# Patient Record
Sex: Female | Born: 1970 | Race: White | Hispanic: No | Marital: Married | Smoking: Never smoker
Health system: Southern US, Community
[De-identification: ages and names within clinical notes are randomized; demographics above are authoritative.]

## PROBLEM LIST (undated history)

## (undated) DIAGNOSIS — E7800 Pure hypercholesterolemia, unspecified: Secondary | ICD-10-CM

## (undated) DIAGNOSIS — Z91018 Allergy to other foods: Secondary | ICD-10-CM

## (undated) DIAGNOSIS — K900 Celiac disease: Secondary | ICD-10-CM

## (undated) DIAGNOSIS — E039 Hypothyroidism, unspecified: Secondary | ICD-10-CM

## (undated) HISTORY — DX: Allergy to other foods: Z91.018

## (undated) HISTORY — DX: Pure hypercholesterolemia, unspecified: E78.00

---

## 2019-04-23 DIAGNOSIS — Z01419 Encounter for gynecological examination (general) (routine) without abnormal findings: Secondary | ICD-10-CM | POA: Insufficient documentation

## 2019-12-18 DIAGNOSIS — E781 Pure hyperglyceridemia: Secondary | ICD-10-CM | POA: Diagnosis not present

## 2019-12-18 DIAGNOSIS — Z6841 Body Mass Index (BMI) 40.0 and over, adult: Secondary | ICD-10-CM | POA: Diagnosis not present

## 2019-12-18 DIAGNOSIS — Z79899 Other long term (current) drug therapy: Secondary | ICD-10-CM | POA: Diagnosis not present

## 2019-12-18 DIAGNOSIS — Z1331 Encounter for screening for depression: Secondary | ICD-10-CM | POA: Diagnosis not present

## 2019-12-18 DIAGNOSIS — Z1211 Encounter for screening for malignant neoplasm of colon: Secondary | ICD-10-CM | POA: Diagnosis not present

## 2019-12-18 DIAGNOSIS — E039 Hypothyroidism, unspecified: Secondary | ICD-10-CM | POA: Diagnosis not present

## 2019-12-18 DIAGNOSIS — G4700 Insomnia, unspecified: Secondary | ICD-10-CM | POA: Diagnosis not present

## 2019-12-20 DIAGNOSIS — E781 Pure hyperglyceridemia: Secondary | ICD-10-CM | POA: Diagnosis not present

## 2019-12-20 DIAGNOSIS — E039 Hypothyroidism, unspecified: Secondary | ICD-10-CM | POA: Diagnosis not present

## 2020-02-19 DIAGNOSIS — Z23 Encounter for immunization: Secondary | ICD-10-CM | POA: Diagnosis not present

## 2020-02-19 DIAGNOSIS — Z6841 Body Mass Index (BMI) 40.0 and over, adult: Secondary | ICD-10-CM | POA: Diagnosis not present

## 2020-02-19 DIAGNOSIS — H00014 Hordeolum externum left upper eyelid: Secondary | ICD-10-CM | POA: Diagnosis not present

## 2020-02-24 DIAGNOSIS — E039 Hypothyroidism, unspecified: Secondary | ICD-10-CM | POA: Diagnosis not present

## 2020-03-31 DIAGNOSIS — Z13220 Encounter for screening for lipoid disorders: Secondary | ICD-10-CM | POA: Diagnosis not present

## 2020-03-31 DIAGNOSIS — Z1231 Encounter for screening mammogram for malignant neoplasm of breast: Secondary | ICD-10-CM | POA: Diagnosis not present

## 2020-03-31 DIAGNOSIS — Z131 Encounter for screening for diabetes mellitus: Secondary | ICD-10-CM | POA: Diagnosis not present

## 2020-03-31 DIAGNOSIS — Z01419 Encounter for gynecological examination (general) (routine) without abnormal findings: Secondary | ICD-10-CM | POA: Diagnosis not present

## 2020-04-06 ENCOUNTER — Other Ambulatory Visit: Payer: Self-pay | Admitting: Certified Nurse Midwife

## 2020-04-06 DIAGNOSIS — Z1231 Encounter for screening mammogram for malignant neoplasm of breast: Secondary | ICD-10-CM

## 2020-04-16 DIAGNOSIS — R5382 Chronic fatigue, unspecified: Secondary | ICD-10-CM | POA: Diagnosis not present

## 2020-04-16 DIAGNOSIS — N951 Menopausal and female climacteric states: Secondary | ICD-10-CM | POA: Diagnosis not present

## 2020-04-16 DIAGNOSIS — R5383 Other fatigue: Secondary | ICD-10-CM | POA: Diagnosis not present

## 2020-05-14 DIAGNOSIS — R5382 Chronic fatigue, unspecified: Secondary | ICD-10-CM | POA: Diagnosis not present

## 2020-05-14 DIAGNOSIS — H5213 Myopia, bilateral: Secondary | ICD-10-CM | POA: Diagnosis not present

## 2020-05-14 DIAGNOSIS — K115 Sialolithiasis: Secondary | ICD-10-CM | POA: Diagnosis not present

## 2020-05-21 ENCOUNTER — Other Ambulatory Visit: Payer: Self-pay

## 2020-05-21 ENCOUNTER — Ambulatory Visit
Admission: RE | Admit: 2020-05-21 | Discharge: 2020-05-21 | Disposition: A | Discharge status: Home / Self Care | Payer: Medicaid Other | Source: Ambulatory Visit | Attending: Certified Nurse Midwife | Admitting: Certified Nurse Midwife

## 2020-05-21 DIAGNOSIS — Z1231 Encounter for screening mammogram for malignant neoplasm of breast: Secondary | ICD-10-CM | POA: Diagnosis not present

## 2020-06-11 DIAGNOSIS — R5382 Chronic fatigue, unspecified: Secondary | ICD-10-CM | POA: Diagnosis not present

## 2020-06-12 ENCOUNTER — Other Ambulatory Visit: Payer: Self-pay | Admitting: Certified Nurse Midwife

## 2020-06-12 DIAGNOSIS — R928 Other abnormal and inconclusive findings on diagnostic imaging of breast: Secondary | ICD-10-CM

## 2020-06-12 DIAGNOSIS — N6489 Other specified disorders of breast: Secondary | ICD-10-CM

## 2020-06-15 DIAGNOSIS — K116 Mucocele of salivary gland: Secondary | ICD-10-CM | POA: Diagnosis not present

## 2020-06-15 DIAGNOSIS — Z9889 Other specified postprocedural states: Secondary | ICD-10-CM | POA: Diagnosis not present

## 2020-06-15 DIAGNOSIS — J342 Deviated nasal septum: Secondary | ICD-10-CM | POA: Diagnosis not present

## 2020-06-22 ENCOUNTER — Ambulatory Visit
Admission: RE | Admit: 2020-06-22 | Discharge: 2020-06-22 | Disposition: A | Discharge status: Home / Self Care | Payer: Medicaid Other | Source: Ambulatory Visit | Attending: Certified Nurse Midwife | Admitting: Certified Nurse Midwife

## 2020-06-22 ENCOUNTER — Other Ambulatory Visit: Payer: Self-pay

## 2020-06-22 DIAGNOSIS — R928 Other abnormal and inconclusive findings on diagnostic imaging of breast: Secondary | ICD-10-CM | POA: Diagnosis present

## 2020-06-22 DIAGNOSIS — N6489 Other specified disorders of breast: Secondary | ICD-10-CM | POA: Diagnosis present

## 2020-06-22 DIAGNOSIS — R922 Inconclusive mammogram: Secondary | ICD-10-CM | POA: Diagnosis not present

## 2020-06-26 DIAGNOSIS — H524 Presbyopia: Secondary | ICD-10-CM | POA: Diagnosis not present

## 2020-07-09 DIAGNOSIS — R5382 Chronic fatigue, unspecified: Secondary | ICD-10-CM | POA: Diagnosis not present

## 2020-07-09 DIAGNOSIS — F411 Generalized anxiety disorder: Secondary | ICD-10-CM | POA: Diagnosis not present

## 2020-07-21 DIAGNOSIS — Z01818 Encounter for other preprocedural examination: Secondary | ICD-10-CM | POA: Diagnosis not present

## 2020-07-21 DIAGNOSIS — Z1211 Encounter for screening for malignant neoplasm of colon: Secondary | ICD-10-CM | POA: Diagnosis not present

## 2020-08-12 DIAGNOSIS — N951 Menopausal and female climacteric states: Secondary | ICD-10-CM | POA: Diagnosis not present

## 2020-08-12 DIAGNOSIS — E782 Mixed hyperlipidemia: Secondary | ICD-10-CM | POA: Diagnosis not present

## 2020-08-12 DIAGNOSIS — E559 Vitamin D deficiency, unspecified: Secondary | ICD-10-CM | POA: Diagnosis not present

## 2020-08-12 DIAGNOSIS — E038 Other specified hypothyroidism: Secondary | ICD-10-CM | POA: Diagnosis not present

## 2020-08-12 DIAGNOSIS — E119 Type 2 diabetes mellitus without complications: Secondary | ICD-10-CM | POA: Diagnosis not present

## 2020-08-12 DIAGNOSIS — R5382 Chronic fatigue, unspecified: Secondary | ICD-10-CM | POA: Diagnosis not present

## 2020-08-12 DIAGNOSIS — G4701 Insomnia due to medical condition: Secondary | ICD-10-CM | POA: Diagnosis not present

## 2020-08-12 DIAGNOSIS — I10 Essential (primary) hypertension: Secondary | ICD-10-CM | POA: Diagnosis not present

## 2020-08-12 DIAGNOSIS — D518 Other vitamin B12 deficiency anemias: Secondary | ICD-10-CM | POA: Diagnosis not present

## 2020-08-12 DIAGNOSIS — F411 Generalized anxiety disorder: Secondary | ICD-10-CM | POA: Diagnosis not present

## 2020-09-07 DIAGNOSIS — R944 Abnormal results of kidney function studies: Secondary | ICD-10-CM | POA: Diagnosis not present

## 2020-09-07 DIAGNOSIS — R5382 Chronic fatigue, unspecified: Secondary | ICD-10-CM | POA: Diagnosis not present

## 2020-09-07 DIAGNOSIS — G4701 Insomnia due to medical condition: Secondary | ICD-10-CM | POA: Diagnosis not present

## 2020-10-01 ENCOUNTER — Encounter: Payer: Self-pay | Admitting: *Deleted

## 2020-10-02 ENCOUNTER — Ambulatory Visit
Admission: RE | Admit: 2020-10-02 | Discharge: 2020-10-02 | Disposition: A | Discharge status: Home / Self Care | Payer: Medicaid Other | Attending: Gastroenterology | Admitting: Gastroenterology

## 2020-10-02 ENCOUNTER — Encounter
Admission: RE | Disposition: A | Discharge status: Home / Self Care | Payer: Self-pay | Source: Home / Self Care | Attending: Gastroenterology

## 2020-10-02 ENCOUNTER — Ambulatory Visit: Payer: Medicaid Other | Admitting: Anesthesiology

## 2020-10-02 DIAGNOSIS — E039 Hypothyroidism, unspecified: Secondary | ICD-10-CM | POA: Insufficient documentation

## 2020-10-02 DIAGNOSIS — D123 Benign neoplasm of transverse colon: Secondary | ICD-10-CM | POA: Diagnosis not present

## 2020-10-02 DIAGNOSIS — K640 First degree hemorrhoids: Secondary | ICD-10-CM | POA: Diagnosis not present

## 2020-10-02 DIAGNOSIS — Z1211 Encounter for screening for malignant neoplasm of colon: Secondary | ICD-10-CM | POA: Insufficient documentation

## 2020-10-02 DIAGNOSIS — Z79899 Other long term (current) drug therapy: Secondary | ICD-10-CM | POA: Insufficient documentation

## 2020-10-02 DIAGNOSIS — Z79890 Hormone replacement therapy (postmenopausal): Secondary | ICD-10-CM | POA: Insufficient documentation

## 2020-10-02 DIAGNOSIS — K635 Polyp of colon: Secondary | ICD-10-CM | POA: Diagnosis not present

## 2020-10-02 DIAGNOSIS — K648 Other hemorrhoids: Secondary | ICD-10-CM | POA: Diagnosis not present

## 2020-10-02 HISTORY — DX: Hypothyroidism, unspecified: E03.9

## 2020-10-02 HISTORY — DX: Celiac disease: K90.0

## 2020-10-02 HISTORY — PX: COLONOSCOPY: SHX5424

## 2020-10-02 SURGERY — COLONOSCOPY
Anesthesia: General

## 2020-10-02 MED ORDER — LIDOCAINE HCL (CARDIAC) PF 100 MG/5ML IV SOSY
PREFILLED_SYRINGE | INTRAVENOUS | Status: DC | PRN
  Administered 2020-10-02: 20 mg via INTRAVENOUS

## 2020-10-02 MED ORDER — PROPOFOL 10 MG/ML IV BOLUS
INTRAVENOUS | Status: AC
  Filled 2020-10-02: qty 20

## 2020-10-02 MED ORDER — PROPOFOL 10 MG/ML IV BOLUS
INTRAVENOUS | Status: DC | PRN
  Administered 2020-10-02: 100 mg via INTRAVENOUS

## 2020-10-02 MED ORDER — PROPOFOL 500 MG/50ML IV EMUL
INTRAVENOUS | Status: DC | PRN
  Administered 2020-10-02: 200 ug/kg/min via INTRAVENOUS

## 2020-10-02 MED ORDER — PROPOFOL 500 MG/50ML IV EMUL
INTRAVENOUS | Status: AC
  Filled 2020-10-02: qty 50

## 2020-10-02 MED ORDER — SODIUM CHLORIDE 0.9 % IV SOLN
INTRAVENOUS | Status: DC
  Administered 2020-10-02: 20 mL/h via INTRAVENOUS

## 2020-10-02 NOTE — Op Note (Signed)
[facility] Gastroenterology Patient Name: Ebony Ortiz Procedure Date: 10/02/2020 8:18 AM MRN: [id number] Account #: [id number] Date of Birth: 07-09-1970 Admit Type: Outpatient Age: 50 Room: [facility] ENDO ROOM 3 Gender: Female Note Status: Finalized Procedure:             Colonoscopy Indications:           Screening for colorectal malignant neoplasm Providers:             Andrey Farmer MD, MD Referring MD:          Orlando Penner. Iona Beard (Referring MD) Medicines:             Monitored Anesthesia Care Complications:         No immediate complications. Estimated blood loss:                         Minimal. Procedure:             Pre-Anesthesia Assessment:                        - Prior to the procedure, a History and Physical was                         performed, and patient medications and allergies were                         reviewed. The patient is competent. The risks and                         benefits of the procedure and the sedation options and                         risks were discussed with the patient. All questions                         were answered and informed consent was obtained.                         Patient identification and proposed procedure were                         verified by the physician, the nurse, the anesthetist                         and the technician in the endoscopy suite. Mental                         Status Examination: alert and oriented. Airway                         Examination: normal oropharyngeal airway and neck                         mobility. Respiratory Examination: clear to                         auscultation. CV Examination: normal. Prophylactic  Antibiotics: The patient does not require prophylactic                         antibiotics. Prior Anticoagulants: The patient has                         taken no previous anticoagulant or antiplatelet                         agents. ASA Grade  Assessment: II - A patient with mild                         systemic disease. After reviewing the risks and                         benefits, the patient was deemed in satisfactory                         condition to undergo the procedure. The anesthesia                         plan was to use monitored anesthesia care (MAC).                         Immediately prior to administration of medications,                         the patient was re-assessed for adequacy to receive                         sedatives. The heart rate, respiratory rate, oxygen                         saturations, blood pressure, adequacy of pulmonary                         ventilation, and response to care were monitored                         throughout the procedure. The physical status of the                         patient was re-assessed after the procedure.                        After obtaining informed consent, the colonoscope was                         passed under direct vision. Throughout the procedure,                         the patient's blood pressure, pulse, and oxygen                         saturations were monitored continuously. The                         Colonoscope was introduced through the anus and  advanced to the the cecum, identified by appendiceal                         orifice and ileocecal valve. The colonoscopy was                         performed without difficulty. The patient tolerated                         the procedure well. The quality of the bowel                         preparation was good. Findings:      The perianal and digital rectal examinations were normal.      A less than 1 mm polyp was found in the proximal transverse colon. The       polyp was sessile. The polyp was removed with a jumbo cold forceps.       Resection and retrieval were complete. Estimated blood loss was minimal.      Internal hemorrhoids were found during retroflexion.  The hemorrhoids       were Grade I (internal hemorrhoids that do not prolapse).      The exam was otherwise without abnormality on direct and retroflexion       views. Impression:            - One less than 1 mm polyp in the proximal transverse                         colon, removed with a jumbo cold forceps. Resected and                         retrieved.                        - Internal hemorrhoids.                        - The examination was otherwise normal on direct and                         retroflexion views. Recommendation:        - Discharge patient to home.                        - Resume previous diet.                        - Continue present medications.                        - Await pathology results.                        - Repeat colonoscopy for surveillance based on                         pathology results.                        - Return to referring physician as previously  scheduled. Procedure Code(s):     --- Professional ---                        [id number], Colonoscopy, flexible; with biopsy, single or                         multiple Diagnosis Code(s):     --- Professional ---                        Z12.11, Encounter for screening for malignant neoplasm                         of colon                        K63.5, Polyp of colon                        K64.0, First degree hemorrhoids CPT copyright 2019 [employer]. All rights reserved. The codes documented in this report are preliminary and upon coder review may  be revised to meet current compliance requirements. Andrey Farmer MD, MD 10/02/2020 8:52:25 AM Number of Addenda: 0 Note Initiated On: 10/02/2020 8:18 AM Scope Withdrawal Time: 0 hours 8 minutes 6 seconds  Total Procedure Duration: 0 hours 13 minutes 22 seconds  Estimated Blood Loss:  Estimated blood loss was minimal.      [facility]

## 2020-10-02 NOTE — H&P (Signed)
Outpatient short stay form Pre-procedure 10/02/2020 8:19 AM Raylene Miyamoto MD, MPH  Primary Physician: Dr. Iona Beard  Reason for visit:  Screening  History of present illness:   50 y/o lady with history of hypothyroidism here for screening colonoscopy. No abdominal surgeries. No blood thinners. No family history of GI malignancies.    Current Facility-Administered Medications:  .  0.9 %  sodium chloride infusion, , Intravenous, Continuous, Lear Carstens, Hilton Cork, MD, Last Rate: 20 mL/hr at 10/02/20 0741, 20 mL/hr at 10/02/20 0741  Medications Prior to Admission  Medication Sig Dispense Refill Last Dose  . ALPRAZolam (XANAX) 1 MG tablet Take 1 mg by mouth at bedtime as needed for anxiety.   Past Week at Unknown time  . APPLE CIDER VINEGAR PO Take by mouth daily.   Past Week at Unknown time  . Ascorbic Acid (VITAMIN C) 1000 MG tablet Take 1,000 mg by mouth daily.   Past Week at Unknown time  . Calcium Carbonate-Vitamin D (OS-CAL 500 + D PO) Take by mouth daily.   Past Week at Unknown time  . fenofibrate (TRICOR) 145 MG tablet Take 145 mg by mouth daily.   Past Week at Unknown time  . levothyroxine (SYNTHROID) 125 MCG tablet Take 125 mcg by mouth daily before breakfast.   10/02/2020 at 0600  . Magnesium 200 MG TABS Take by mouth daily.   Past Week at Unknown time  . Multiple Vitamin (MULTIVITAMIN) capsule Take 1 capsule by mouth daily.   Past Week at Unknown time  . phentermine (ADIPEX-P) 37.5 MG tablet Take 37.5 mg by mouth daily.   Past Week at Unknown time  . progesterone (PROMETRIUM) 200 MG capsule Take 200 mg by mouth daily.   Past Week at Unknown time  . traZODone (DESYREL) 100 MG tablet Take 200 mg by mouth at bedtime.   Past Week at Unknown time  . VITAMIN B COMPLEX-C PO Take by mouth.   Past Week at Unknown time     No Known Allergies   Past Medical History:  Diagnosis Date  . Celiac disease   . Hypothyroidism     Review of systems:  Otherwise negative.    Physical  Exam  Gen: Alert, oriented. Appears stated age.  HEENT: PERRLA. Lungs: No respiratory distress CV: RRR Abd: soft, benign, no masses Ext: No edema    Planned procedures: Proceed with colonoscopy. The patient understands the nature of the planned procedure, indications, risks, alternatives and potential complications including but not limited to bleeding, infection, perforation, damage to internal organs and possible oversedation/side effects from anesthesia. The patient agrees and gives consent to proceed.  Please refer to procedure notes for findings, recommendations and patient disposition/instructions.     Raylene Miyamoto MD, MPH Gastroenterology 10/02/2020  8:19 AM

## 2020-10-02 NOTE — Transfer of Care (Signed)
Immediate Anesthesia Transfer of Care Note  Patient: Ebony Ortiz  Procedure(s) Performed: COLONOSCOPY (N/A )  Patient Location: PACU  Anesthesia Type:MAC  Level of Consciousness: awake, alert  and oriented  Airway & Oxygen Therapy: Patient Spontanous Breathing and Patient connected to nasal cannula oxygen  Post-op Assessment: Report given to RN and Post -op Vital signs reviewed and stable  Post vital signs: Reviewed and stable  Last Vitals:  Vitals Value Taken Time  BP 133/66 10/02/20 0855  Temp    Pulse 80 10/02/20 0901  Resp 21 10/02/20 0901  SpO2 95 % 10/02/20 0901  Vitals shown include unvalidated device data.  Last Pain:  Vitals:   10/02/20 0850  TempSrc: Temporal  PainSc:          Complications: No complications documented.

## 2020-10-02 NOTE — Anesthesia Preprocedure Evaluation (Signed)
Anesthesia Evaluation  Patient identified by MRN, date of birth, ID band Patient awake    Reviewed: Allergy & Precautions, H&P , NPO status , Patient's Chart, lab work & pertinent test results, reviewed documented beta blocker date and time   Airway Mallampati: II   Neck ROM: full    Dental  (+) Poor Dentition   Pulmonary neg pulmonary ROS,    Pulmonary exam normal        Cardiovascular Exercise Tolerance: Good negative cardio ROS Normal cardiovascular exam Rhythm:regular Rate:Normal     Neuro/Psych negative neurological ROS  negative psych ROS   GI/Hepatic negative GI ROS, Neg liver ROS,   Endo/Other  Hypothyroidism   Renal/GU negative Renal ROS  negative genitourinary   Musculoskeletal   Abdominal   Peds  Hematology negative hematology ROS (+)   Anesthesia Other Findings Past Medical History: No date: Celiac disease No date: Hypothyroidism No past surgical history on file. BMI    Body Mass Index: 39.86 kg/m     Reproductive/Obstetrics negative OB ROS                             Anesthesia Physical Anesthesia Plan  ASA: II  Anesthesia Plan: General   Post-op Pain Management:    Induction:   PONV Risk Score and Plan:   Airway Management Planned:   Additional Equipment:   Intra-op Plan:   Post-operative Plan:   Informed Consent: I have reviewed the patients History and Physical, chart, labs and discussed the procedure including the risks, benefits and alternatives for the proposed anesthesia with the patient or authorized representative who has indicated his/her understanding and acceptance.     Dental Advisory Given  Plan Discussed with: CRNA  Anesthesia Plan Comments:         Anesthesia Quick Evaluation

## 2020-10-02 NOTE — Interval H&P Note (Signed)
History and Physical Interval Note:  10/02/2020 8:21 AM  Ebony Ortiz  has presented today for surgery, with the diagnosis of COLON CANCER SCREENING.  The various methods of treatment have been discussed with the patient and family. After consideration of risks, benefits and other options for treatment, the patient has consented to  Procedure(s): COLONOSCOPY (N/A) as a surgical intervention.  The patient's history has been reviewed, patient examined, no change in status, stable for surgery.  I have reviewed the patient's chart and labs.  Questions were answered to the patient's satisfaction.     Lesly Rubenstein  Ok to proceed with colonoscopy

## 2020-10-05 ENCOUNTER — Encounter: Payer: Self-pay | Admitting: Gastroenterology

## 2020-10-05 DIAGNOSIS — R5382 Chronic fatigue, unspecified: Secondary | ICD-10-CM | POA: Diagnosis not present

## 2020-10-05 LAB — SURGICAL PATHOLOGY

## 2020-10-08 NOTE — Anesthesia Postprocedure Evaluation (Signed)
Anesthesia Post Note  Patient: Ebony Ortiz  Procedure(s) Performed: COLONOSCOPY  Anesthesia Type: General Anesthetic complications: no   No notable events documented.   Last Vitals:  Vitals:   10/02/20 0910 10/02/20 0920  BP: 123/72 123/77  Pulse: 72 71  Resp: (!) 36 14  Temp:    SpO2: 100% 97%    Last Pain:  Vitals:   10/04/20 1511  TempSrc:   PainSc: 0-No pain                 Ebony Ortiz

## 2020-11-03 DIAGNOSIS — R5382 Chronic fatigue, unspecified: Secondary | ICD-10-CM | POA: Diagnosis not present

## 2020-12-01 DIAGNOSIS — R5382 Chronic fatigue, unspecified: Secondary | ICD-10-CM | POA: Diagnosis not present

## 2020-12-31 DIAGNOSIS — R5382 Chronic fatigue, unspecified: Secondary | ICD-10-CM | POA: Diagnosis not present

## 2021-02-01 DIAGNOSIS — R5382 Chronic fatigue, unspecified: Secondary | ICD-10-CM | POA: Diagnosis not present

## 2021-03-01 DIAGNOSIS — F411 Generalized anxiety disorder: Secondary | ICD-10-CM | POA: Diagnosis not present

## 2021-03-01 DIAGNOSIS — R5382 Chronic fatigue, unspecified: Secondary | ICD-10-CM | POA: Diagnosis not present

## 2021-03-02 DIAGNOSIS — Z419 Encounter for procedure for purposes other than remedying health state, unspecified: Secondary | ICD-10-CM | POA: Diagnosis not present

## 2021-03-22 DIAGNOSIS — E559 Vitamin D deficiency, unspecified: Secondary | ICD-10-CM | POA: Diagnosis not present

## 2021-03-22 DIAGNOSIS — R5382 Chronic fatigue, unspecified: Secondary | ICD-10-CM | POA: Diagnosis not present

## 2021-03-22 DIAGNOSIS — E038 Other specified hypothyroidism: Secondary | ICD-10-CM | POA: Diagnosis not present

## 2021-03-22 DIAGNOSIS — E782 Mixed hyperlipidemia: Secondary | ICD-10-CM | POA: Diagnosis not present

## 2021-03-22 DIAGNOSIS — E119 Type 2 diabetes mellitus without complications: Secondary | ICD-10-CM | POA: Diagnosis not present

## 2021-03-22 DIAGNOSIS — F411 Generalized anxiety disorder: Secondary | ICD-10-CM | POA: Diagnosis not present

## 2021-03-22 DIAGNOSIS — I10 Essential (primary) hypertension: Secondary | ICD-10-CM | POA: Diagnosis not present

## 2021-03-22 DIAGNOSIS — D518 Other vitamin B12 deficiency anemias: Secondary | ICD-10-CM | POA: Diagnosis not present

## 2021-04-01 DIAGNOSIS — Z01419 Encounter for gynecological examination (general) (routine) without abnormal findings: Secondary | ICD-10-CM | POA: Diagnosis not present

## 2021-04-01 DIAGNOSIS — Z1231 Encounter for screening mammogram for malignant neoplasm of breast: Secondary | ICD-10-CM | POA: Diagnosis not present

## 2021-04-01 DIAGNOSIS — Z419 Encounter for procedure for purposes other than remedying health state, unspecified: Secondary | ICD-10-CM | POA: Diagnosis not present

## 2021-04-21 DIAGNOSIS — E782 Mixed hyperlipidemia: Secondary | ICD-10-CM | POA: Diagnosis not present

## 2021-04-21 DIAGNOSIS — R5382 Chronic fatigue, unspecified: Secondary | ICD-10-CM | POA: Diagnosis not present

## 2021-04-21 DIAGNOSIS — F411 Generalized anxiety disorder: Secondary | ICD-10-CM | POA: Diagnosis not present

## 2021-05-02 DIAGNOSIS — Z419 Encounter for procedure for purposes other than remedying health state, unspecified: Secondary | ICD-10-CM | POA: Diagnosis not present

## 2021-05-20 DIAGNOSIS — F411 Generalized anxiety disorder: Secondary | ICD-10-CM | POA: Diagnosis not present

## 2021-05-20 DIAGNOSIS — R5382 Chronic fatigue, unspecified: Secondary | ICD-10-CM | POA: Diagnosis not present

## 2021-06-02 DIAGNOSIS — Z419 Encounter for procedure for purposes other than remedying health state, unspecified: Secondary | ICD-10-CM | POA: Diagnosis not present

## 2021-06-30 DIAGNOSIS — Z419 Encounter for procedure for purposes other than remedying health state, unspecified: Secondary | ICD-10-CM | POA: Diagnosis not present

## 2021-07-05 ENCOUNTER — Other Ambulatory Visit: Payer: Self-pay | Admitting: Certified Nurse Midwife

## 2021-07-05 DIAGNOSIS — Z1231 Encounter for screening mammogram for malignant neoplasm of breast: Secondary | ICD-10-CM

## 2021-07-28 ENCOUNTER — Ambulatory Visit: Payer: Medicaid Other

## 2021-07-28 DIAGNOSIS — J019 Acute sinusitis, unspecified: Secondary | ICD-10-CM | POA: Diagnosis not present

## 2021-07-31 DIAGNOSIS — Z419 Encounter for procedure for purposes other than remedying health state, unspecified: Secondary | ICD-10-CM | POA: Diagnosis not present

## 2021-08-11 ENCOUNTER — Ambulatory Visit
Admission: RE | Admit: 2021-08-11 | Discharge: 2021-08-11 | Disposition: A | Discharge status: Home / Self Care | Payer: Medicaid Other | Source: Ambulatory Visit | Attending: Certified Nurse Midwife | Admitting: Certified Nurse Midwife

## 2021-08-11 DIAGNOSIS — Z1231 Encounter for screening mammogram for malignant neoplasm of breast: Secondary | ICD-10-CM | POA: Insufficient documentation

## 2021-08-18 DIAGNOSIS — E119 Type 2 diabetes mellitus without complications: Secondary | ICD-10-CM | POA: Diagnosis not present

## 2021-08-18 DIAGNOSIS — I10 Essential (primary) hypertension: Secondary | ICD-10-CM | POA: Diagnosis not present

## 2021-08-18 DIAGNOSIS — E559 Vitamin D deficiency, unspecified: Secondary | ICD-10-CM | POA: Diagnosis not present

## 2021-08-18 DIAGNOSIS — N951 Menopausal and female climacteric states: Secondary | ICD-10-CM | POA: Diagnosis not present

## 2021-08-18 DIAGNOSIS — D518 Other vitamin B12 deficiency anemias: Secondary | ICD-10-CM | POA: Diagnosis not present

## 2021-08-18 DIAGNOSIS — R5382 Chronic fatigue, unspecified: Secondary | ICD-10-CM | POA: Diagnosis not present

## 2021-08-18 DIAGNOSIS — J019 Acute sinusitis, unspecified: Secondary | ICD-10-CM | POA: Diagnosis not present

## 2021-08-18 DIAGNOSIS — E7849 Other hyperlipidemia: Secondary | ICD-10-CM | POA: Diagnosis not present

## 2021-08-18 DIAGNOSIS — Z79899 Other long term (current) drug therapy: Secondary | ICD-10-CM | POA: Diagnosis not present

## 2021-08-18 DIAGNOSIS — E038 Other specified hypothyroidism: Secondary | ICD-10-CM | POA: Diagnosis not present

## 2021-08-30 DIAGNOSIS — Z419 Encounter for procedure for purposes other than remedying health state, unspecified: Secondary | ICD-10-CM | POA: Diagnosis not present

## 2021-09-30 DIAGNOSIS — Z419 Encounter for procedure for purposes other than remedying health state, unspecified: Secondary | ICD-10-CM | POA: Diagnosis not present

## 2021-12-07 DIAGNOSIS — Z1331 Encounter for screening for depression: Secondary | ICD-10-CM | POA: Diagnosis not present

## 2021-12-07 DIAGNOSIS — Z79899 Other long term (current) drug therapy: Secondary | ICD-10-CM | POA: Diagnosis not present

## 2021-12-07 DIAGNOSIS — E039 Hypothyroidism, unspecified: Secondary | ICD-10-CM | POA: Diagnosis not present

## 2021-12-07 DIAGNOSIS — G4700 Insomnia, unspecified: Secondary | ICD-10-CM | POA: Diagnosis not present

## 2021-12-07 DIAGNOSIS — E781 Pure hyperglyceridemia: Secondary | ICD-10-CM | POA: Diagnosis not present

## 2022-04-01 DIAGNOSIS — Z419 Encounter for procedure for purposes other than remedying health state, unspecified: Secondary | ICD-10-CM | POA: Diagnosis not present

## 2022-04-05 DIAGNOSIS — Z13220 Encounter for screening for lipoid disorders: Secondary | ICD-10-CM | POA: Diagnosis not present

## 2022-04-05 DIAGNOSIS — Z131 Encounter for screening for diabetes mellitus: Secondary | ICD-10-CM | POA: Diagnosis not present

## 2022-04-05 DIAGNOSIS — Z130 Encounter for screening for diseases of the blood and blood-forming organs and certain disorders involving the immune mechanism: Secondary | ICD-10-CM | POA: Diagnosis not present

## 2022-04-05 DIAGNOSIS — Z01419 Encounter for gynecological examination (general) (routine) without abnormal findings: Secondary | ICD-10-CM | POA: Diagnosis not present

## 2022-05-02 DIAGNOSIS — Z419 Encounter for procedure for purposes other than remedying health state, unspecified: Secondary | ICD-10-CM | POA: Diagnosis not present

## 2022-05-09 DIAGNOSIS — H5213 Myopia, bilateral: Secondary | ICD-10-CM | POA: Diagnosis not present

## 2022-05-18 DIAGNOSIS — Z20828 Contact with and (suspected) exposure to other viral communicable diseases: Secondary | ICD-10-CM | POA: Diagnosis not present

## 2022-05-18 DIAGNOSIS — U071 COVID-19: Secondary | ICD-10-CM | POA: Diagnosis not present

## 2022-05-18 DIAGNOSIS — J069 Acute upper respiratory infection, unspecified: Secondary | ICD-10-CM | POA: Diagnosis not present

## 2022-06-02 DIAGNOSIS — Z419 Encounter for procedure for purposes other than remedying health state, unspecified: Secondary | ICD-10-CM | POA: Diagnosis not present

## 2022-06-29 DIAGNOSIS — J302 Other seasonal allergic rhinitis: Secondary | ICD-10-CM | POA: Diagnosis not present

## 2022-06-29 DIAGNOSIS — Z79899 Other long term (current) drug therapy: Secondary | ICD-10-CM | POA: Diagnosis not present

## 2022-06-29 DIAGNOSIS — E1165 Type 2 diabetes mellitus with hyperglycemia: Secondary | ICD-10-CM | POA: Diagnosis not present

## 2022-06-29 DIAGNOSIS — E7849 Other hyperlipidemia: Secondary | ICD-10-CM | POA: Diagnosis not present

## 2022-06-29 DIAGNOSIS — M25561 Pain in right knee: Secondary | ICD-10-CM | POA: Diagnosis not present

## 2022-06-29 DIAGNOSIS — D518 Other vitamin B12 deficiency anemias: Secondary | ICD-10-CM | POA: Diagnosis not present

## 2022-06-29 DIAGNOSIS — E038 Other specified hypothyroidism: Secondary | ICD-10-CM | POA: Diagnosis not present

## 2022-06-29 DIAGNOSIS — E559 Vitamin D deficiency, unspecified: Secondary | ICD-10-CM | POA: Diagnosis not present

## 2022-06-29 DIAGNOSIS — F411 Generalized anxiety disorder: Secondary | ICD-10-CM | POA: Diagnosis not present

## 2022-06-29 DIAGNOSIS — M79642 Pain in left hand: Secondary | ICD-10-CM | POA: Diagnosis not present

## 2022-06-29 DIAGNOSIS — I10 Essential (primary) hypertension: Secondary | ICD-10-CM | POA: Diagnosis not present

## 2022-06-29 DIAGNOSIS — E782 Mixed hyperlipidemia: Secondary | ICD-10-CM | POA: Diagnosis not present

## 2022-07-01 DIAGNOSIS — Z419 Encounter for procedure for purposes other than remedying health state, unspecified: Secondary | ICD-10-CM | POA: Diagnosis not present

## 2022-07-07 ENCOUNTER — Other Ambulatory Visit: Payer: Self-pay | Admitting: Family

## 2022-07-07 DIAGNOSIS — Z1231 Encounter for screening mammogram for malignant neoplasm of breast: Secondary | ICD-10-CM

## 2022-07-07 DIAGNOSIS — M67442 Ganglion, left hand: Secondary | ICD-10-CM | POA: Diagnosis not present

## 2022-07-26 ENCOUNTER — Ambulatory Visit (INDEPENDENT_AMBULATORY_CARE_PROVIDER_SITE_OTHER): Payer: Medicaid Other | Admitting: Bariatrics

## 2022-07-26 ENCOUNTER — Encounter: Payer: Self-pay | Admitting: Bariatrics

## 2022-07-26 VITALS — BP 125/79 | HR 84 | Temp 97.8°F | Ht 63.0 in | Wt 266.0 lb

## 2022-07-26 DIAGNOSIS — Z6841 Body Mass Index (BMI) 40.0 and over, adult: Secondary | ICD-10-CM

## 2022-07-26 DIAGNOSIS — Z0289 Encounter for other administrative examinations: Secondary | ICD-10-CM

## 2022-07-26 DIAGNOSIS — K900 Celiac disease: Secondary | ICD-10-CM | POA: Diagnosis not present

## 2022-07-26 DIAGNOSIS — E038 Other specified hypothyroidism: Secondary | ICD-10-CM

## 2022-07-26 NOTE — Progress Notes (Signed)
Office: [phone]  /  Fax: [phone]   Initial Visit  Ebony Ortiz was seen in clinic today to evaluate for obesity. She is interested in losing weight to improve overall health and reduce the risk of weight related complications. She presents today to review program treatment options, initial physical assessment, and evaluation.     She was referred by: PCP  When asked what else they would like to accomplish? She states: Improve energy levels and physical activity and Improve quality of life  When asked how has your weight affected you? She states: Having fatigue  Some associated conditions: Other: hypothyroidism, Celiac disease,   Contributing factors: Family history  Weight promoting medications identified: None  Current nutrition plan: None, Low-carb, High-protein, and Other: Intermittent fasting  Current level of physical activity: None and Walking  Current or previous pharmacotherapy: Phentermine  Response to medication: Ineffective so it was discontinued   Past medical history includes:   Past Medical History:  Diagnosis Date   Celiac disease    Hypothyroidism      Objective:   BP 125/79   Pulse 84   Temp 97.8 F (36.6 C)   Ht 5\' 3"  (1.6 m)   Wt 266 lb (120.7 kg)   SpO2 97%   BMI 47.12 kg/m  She was weighed on the bioimpedance scale: Body mass index is 47.12 kg/m.  Peak Weight:266 , Body Fat%:51.1%, Visceral Fat Rating:18, Weight trend over the last 12 months: Increasing  General:  Alert, oriented and cooperative. Patient is in no acute distress.  Respiratory: Normal respiratory effort, no problems with respiration noted  Extremities: Normal range of motion.    Mental Status: Normal mood and affect. Normal behavior. Normal judgment and thought content.   DIAGNOSTIC DATA REVIEWED:  BMET No results found for: "NA", "K", "CL", "CO2", "GLUCOSE", "BUN", "CREATININE", "CALCIUM", "GFRNONAA", "GFRAA" No results found for: "HGBA1C" No results found for:  "INSULIN" CBC No results found for: "WBC", "RBC", "HGB", "HCT", "PLT", "MCV", "MCH", "MCHC", "RDW" Iron/TIBC/Ferritin/ %Sat No results found for: "IRON", "TIBC", "FERRITIN", "IRONPCTSAT" Lipid Panel  No results found for: "CHOL", "TRIG", "HDL", "CHOLHDL", "VLDL", "LDLCALC", "LDLDIRECT" Hepatic Function Panel  No results found for: "PROT", "ALBUMIN", "AST", "ALT", "ALKPHOS", "BILITOT", "BILIDIR", "IBILI" No results found for: "TSH"   Assessment and Plan:    1. Celiac Disease:  Plan: will adjust the food plan accordingly.   2. Hypothyroidism  Does report symptoms associated with uncontrolled hypothyroidism. She states that her PCP is referring her to an endocrinologist.  Medication(s): Levothyroxine 150 mcg daily per patient.   Plan: Follow-up with PCP and endocrinologist.   Plan: Continue levothyroxine at current dose and adjust per endocrinologist.  Counseling: The correct way to take levothyroxine is fasting, with water, separated by at least 30 minutes from breakfast, and separated by more than 4 hours from calcium, iron, multivitamins, acid reflux medications (PPIs).    3.Morbid obesity:   4. BMI = 47      Obesity Treatment / Action Plan:  Patient will work on garnering support from family and friends to begin weight loss journey. Will work on eliminating or reducing the presence of highly palatable, calorie dense foods in the home. Will complete provided nutritional and psychosocial assessment questionnaire before the next appointment. Will be scheduled for indirect calorimetry to determine resting energy expenditure in a fasting state.  This will allow [place] to create a reduced calorie, high-protein meal plan to promote loss of fat mass while preserving muscle mass.  Obesity Education Performed  Today:  She was weighed on the bioimpedance scale and results were discussed and documented in the synopsis.  We discussed obesity as a disease and the importance of a more  detailed evaluation of all the factors contributing to the disease.  We discussed the importance of long term lifestyle changes which include nutrition, exercise and behavioral modifications as well as the importance of customizing this to her specific health and social needs.  We discussed the benefits of reaching a healthier weight to alleviate the symptoms of existing conditions and reduce the risks of the biomechanical, metabolic and psychological effects of obesity.  Ebony Ortiz appears to be in the action stage of change and states they are ready to start intensive lifestyle modifications and behavioral modifications.  30 minutes was spent today on this visit including the above counseling, pre-visit chart review, and post-visit documentation.  Reviewed by clinician on day of visit: allergies, medications, problem list, medical history, surgical history, family history, social history, and previous encounter notes.    Ebony Ortiz A. Ruben ImO.

## 2022-08-01 DIAGNOSIS — Z419 Encounter for procedure for purposes other than remedying health state, unspecified: Secondary | ICD-10-CM | POA: Diagnosis not present

## 2022-08-10 DIAGNOSIS — L814 Other melanin hyperpigmentation: Secondary | ICD-10-CM | POA: Diagnosis not present

## 2022-08-10 DIAGNOSIS — D225 Melanocytic nevi of trunk: Secondary | ICD-10-CM | POA: Diagnosis not present

## 2022-08-10 DIAGNOSIS — L578 Other skin changes due to chronic exposure to nonionizing radiation: Secondary | ICD-10-CM | POA: Diagnosis not present

## 2022-08-10 DIAGNOSIS — D2239 Melanocytic nevi of other parts of face: Secondary | ICD-10-CM | POA: Diagnosis not present

## 2022-08-11 ENCOUNTER — Encounter: Payer: Self-pay | Admitting: Bariatrics

## 2022-08-11 ENCOUNTER — Ambulatory Visit (INDEPENDENT_AMBULATORY_CARE_PROVIDER_SITE_OTHER): Payer: Medicaid Other | Admitting: Bariatrics

## 2022-08-11 VITALS — BP 136/84 | HR 79 | Temp 98.2°F | Ht 63.0 in | Wt 270.0 lb

## 2022-08-11 DIAGNOSIS — E038 Other specified hypothyroidism: Secondary | ICD-10-CM

## 2022-08-11 DIAGNOSIS — K900 Celiac disease: Secondary | ICD-10-CM | POA: Diagnosis not present

## 2022-08-11 DIAGNOSIS — R0602 Shortness of breath: Secondary | ICD-10-CM | POA: Insufficient documentation

## 2022-08-11 DIAGNOSIS — R7309 Other abnormal glucose: Secondary | ICD-10-CM

## 2022-08-11 DIAGNOSIS — Z6841 Body Mass Index (BMI) 40.0 and over, adult: Secondary | ICD-10-CM | POA: Diagnosis not present

## 2022-08-11 DIAGNOSIS — Z1331 Encounter for screening for depression: Secondary | ICD-10-CM

## 2022-08-11 DIAGNOSIS — R5383 Other fatigue: Secondary | ICD-10-CM | POA: Diagnosis not present

## 2022-08-11 DIAGNOSIS — E7800 Pure hypercholesterolemia, unspecified: Secondary | ICD-10-CM

## 2022-08-11 DIAGNOSIS — Z0000 Encounter for general adult medical examination without abnormal findings: Secondary | ICD-10-CM | POA: Insufficient documentation

## 2022-08-11 DIAGNOSIS — E559 Vitamin D deficiency, unspecified: Secondary | ICD-10-CM

## 2022-08-12 LAB — CBC WITH DIFFERENTIAL/PLATELET
Basophils Absolute: 0 10*3/uL (ref 0.0–0.2)
Basos: 1 %
EOS (ABSOLUTE): 0.2 10*3/uL (ref 0.0–0.4)
Eos: 4 %
Hematocrit: 34.2 % (ref 34.0–46.6)
Hemoglobin: 11.9 g/dL (ref 11.1–15.9)
Immature Grans (Abs): 0.1 10*3/uL (ref 0.0–0.1)
Immature Granulocytes: 1 %
Lymphocytes Absolute: 1.1 10*3/uL (ref 0.7–3.1)
Lymphs: 24 %
MCH: 31.4 pg (ref 26.6–33.0)
MCHC: 34.8 g/dL (ref 31.5–35.7)
MCV: 90 fL (ref 79–97)
Monocytes Absolute: 0.4 10*3/uL (ref 0.1–0.9)
Monocytes: 8 %
Neutrophils Absolute: 2.9 10*3/uL (ref 1.4–7.0)
Neutrophils: 62 %
Platelets: 199 10*3/uL (ref 150–450)
RBC: 3.79 x10E6/uL (ref 3.77–5.28)
RDW: 12.4 % (ref 11.7–15.4)
WBC: 4.7 10*3/uL (ref 3.4–10.8)

## 2022-08-12 LAB — INSULIN, RANDOM: INSULIN: 27.7 u[IU]/mL — ABNORMAL HIGH (ref 2.6–24.9)

## 2022-08-12 LAB — COMPREHENSIVE METABOLIC PANEL
ALT: 38 IU/L — ABNORMAL HIGH (ref 0–32)
AST: 60 IU/L — ABNORMAL HIGH (ref 0–40)
Albumin/Globulin Ratio: 1.3 (ref 1.2–2.2)
Albumin: 4.5 g/dL (ref 3.8–4.9)
Alkaline Phosphatase: 49 IU/L (ref 44–121)
BUN/Creatinine Ratio: 20 (ref 9–23)
BUN: 22 mg/dL (ref 6–24)
Bilirubin Total: 0.4 mg/dL (ref 0.0–1.2)
CO2: 23 mmol/L (ref 20–29)
Calcium: 9.6 mg/dL (ref 8.7–10.2)
Chloride: 97 mmol/L (ref 96–106)
Creatinine, Ser: 1.12 mg/dL — ABNORMAL HIGH (ref 0.57–1.00)
Globulin, Total: 3.5 g/dL (ref 1.5–4.5)
Glucose: 91 mg/dL (ref 70–99)
Potassium: 4.4 mmol/L (ref 3.5–5.2)
Sodium: 134 mmol/L (ref 134–144)
Total Protein: 8 g/dL (ref 6.0–8.5)
eGFR: 59 mL/min/{1.73_m2} — ABNORMAL LOW (ref >59)

## 2022-08-12 LAB — LIPID PANEL WITH LDL/HDL RATIO
Cholesterol, Total: 148 mg/dL (ref 100–199)
HDL: 26 mg/dL — ABNORMAL LOW (ref >39)
LDL Chol Calc (NIH): 90 mg/dL (ref 0–99)
LDL/HDL Ratio: 3.5 ratio — ABNORMAL HIGH (ref 0.0–3.2)
Triglycerides: 185 mg/dL — ABNORMAL HIGH (ref 0–149)
VLDL Cholesterol Cal: 32 mg/dL (ref 5–40)

## 2022-08-12 LAB — TSH: TSH: 24 u[IU]/mL — ABNORMAL HIGH (ref 0.450–4.500)

## 2022-08-12 LAB — HEMOGLOBIN A1C
Est. average glucose Bld gHb Est-mCnc: 114 mg/dL
Hgb A1c MFr Bld: 5.6 % (ref 4.8–5.6)

## 2022-08-12 LAB — VITAMIN D 25 HYDROXY (VIT D DEFICIENCY, FRACTURES): Vit D, 25-Hydroxy: 29.7 ng/mL — ABNORMAL LOW (ref 30.0–100.0)

## 2022-08-12 LAB — VITAMIN B12: Vitamin B-12: 507 pg/mL (ref 232–1245)

## 2022-08-15 ENCOUNTER — Encounter: Payer: Self-pay | Admitting: Bariatrics

## 2022-08-15 ENCOUNTER — Other Ambulatory Visit (INDEPENDENT_AMBULATORY_CARE_PROVIDER_SITE_OTHER): Payer: Self-pay | Admitting: Bariatrics

## 2022-08-15 MED ORDER — LEVOTHYROXINE SODIUM 150 MCG PO TABS: 150.0000 ug | ORAL_TABLET | Freq: Every day | ORAL | 1 refills | Status: AC

## 2022-08-15 NOTE — Progress Notes (Signed)
Notified patient of Dr. Theora Gianotti recommendations. Confirmed patients pharmacy.

## 2022-08-16 ENCOUNTER — Encounter: Payer: Self-pay | Admitting: Bariatrics

## 2022-08-16 NOTE — Progress Notes (Signed)
Chief Complaint:   OBESITY Ebony Ortiz (MR# [id number]) is a 52 y.o. female who presents for evaluation and treatment of obesity and related comorbidities. Current BMI is Body mass index is 47.83 kg/m. Ebony Ortiz has been struggling with her weight for many years and has been unsuccessful in either losing weight, maintaining weight loss, or reaching her healthy weight goal.  Ebony Ortiz is here for an initial visit. She was see by me for hr information session visit.   Ebony Ortiz is currently in the action stage of change and ready to dedicate time achieving and maintaining a healthier weight. Ebony Ortiz is interested in becoming our patient and working on intensive lifestyle modifications including (but not limited to) diet and exercise for weight loss.  Ebony Ortiz's habits were reviewed today and are as follows: Her family eats meals together, she thinks her family will eat healthier with her, her desired weight loss is 105 lbs, she started gaining weight last year, her heaviest weight ever was 270 pounds, she has significant food cravings issues, she skips meals frequently, she is frequently drinking liquids with calories, and she struggles with emotional eating.  Depression Screen Ebony Ortiz's Food and Mood (modified PHQ-9) score was 12.  Subjective:   1. Other fatigue Ebony Ortiz admits to daytime somnolence and admits to waking up still tired. Patient has a history of symptoms of daytime fatigue and morning fatigue. Ebony Ortiz generally gets 3 or 4 hours of sleep per night, and states that she has nightime awakenings. Snoring is present. Apneic episodes are not present. Epworth Sleepiness Score is 3.   2. SOB (shortness of breath) on exertion Ebony Ortiz notes increasing shortness of breath with exercising and seems to be worsening over time with weight gain. She notes getting out of breath sooner with activity than she used to. This has not gotten worse recently. Ebony Ortiz denies shortness of breath at rest or orthopnea.  3. Other  specified hypothyroidism Ebony Ortiz is taking levothyroxine, unstable.   4. Celiac disease Ebony Ortiz has been avoiding all gluten.   5. Health care maintenance Given obesity.   6. Elevated cholesterol Ebony Ortiz is taking fenofibrate.   7. Vitamin D deficiency Ebony Ortiz is taking Vitamin D.   8. Elevated glucose Ebony Ortiz is not on medications.   Assessment/Plan:   1. Other fatigue Ebony Ortiz does feel that her weight is causing her energy to be lower than it should be. Fatigue may be related to obesity, depression or many other causes. Labs will be ordered, and in the meanwhile, Ebony Ortiz will focus on self care including making healthy food choices, increasing physical activity and focusing on stress reduction.  - EKG 12-Lead - Vitamin B12 - TSH  2. SOB (shortness of breath) on exertion Ebony Ortiz does feel that she gets out of breath more easily that she used to when she exercises. Ebony Ortiz's shortness of breath appears to be obesity related and exercise induced. She has agreed to work on weight loss and gradually increase exercise to treat her exercise induced shortness of breath. Will continue to monitor closely.  - TSH  3. Other specified hypothyroidism We will check labs today. Ebony Ortiz will continue her medications.   4. Celiac disease Ebony Ortiz will begin the plan, but she will avoid gluten.   5. Health care maintenance We will check labs today. EKG and IC were done today and reviewed with the patient today.   - Hemoglobin A1c - Insulin, random - Vitamin B12 - Lipid Panel With LDL/HDL Ratio - VITAMIN D 25 Hydroxy (Vit-D Deficiency,  Fractures) - TSH - Comprehensive metabolic panel - CBC with Differential/Platelet  6. Elevated cholesterol We will check labs today. Ebony Ortiz will continue her medication.   - Lipid Panel With LDL/HDL Ratio  7. Vitamin D deficiency We will check labs today, and we will follow-up at Ebony Ortiz next visit.   - VITAMIN D 25 Hydroxy (Vit-D Deficiency, Fractures)  8. Elevated glucose We  will check labs today, and we will follow-up at Ebony Ortiz next visit.   - Hemoglobin A1c - Insulin, random  9. Depression screening Ebony Ortiz had a positive depression screening. Depression is commonly associated with obesity and often results in emotional eating behaviors. We will monitor this closely and work on CBT to help improve the non-hunger eating patterns. Referral to Psychology may be required if no improvement is seen as she continues in our clinic.  10. Morbid obesity - Hemoglobin A1c - Insulin, random - Vitamin B12 - Lipid Panel With LDL/HDL Ratio - VITAMIN D 25 Hydroxy (Vit-D Deficiency, Fractures) - TSH - Comprehensive metabolic panel - CBC with Differential/Platelet  11. BMI 45.0-49.9, adult We will check labs today.   - Hemoglobin A1c - Insulin, random - Vitamin B12 - Lipid Panel With LDL/HDL Ratio - VITAMIN D 25 Hydroxy (Vit-D Deficiency, Fractures) - TSH - Comprehensive metabolic panel - CBC with Differential/Platelet  Ebony Ortiz is currently in the action stage of change and her goal is to continue with weight loss efforts. I recommend Ebony Ortiz begin the structured treatment plan as follows:  She has agreed to the Category 4 Plan and keeping a food journal and adhering to recommended goals of 1800 calories and 110-120 grams of protein.  Meal planning was discussed.   Exercise goals: No exercise has been prescribed at this time.   Behavioral modification strategies: increasing lean protein intake, decreasing simple carbohydrates, increasing vegetables, increasing water intake, decreasing eating out, no skipping meals, meal planning and cooking strategies, keeping healthy foods in the home, and planning for success.  She was informed of the importance of frequent follow-up visits to maximize her success with intensive lifestyle modifications for her multiple health conditions. She was informed we would discuss her lab results at her next visit unless there is a critical issue  that needs to be addressed sooner. Ebony Ortiz agreed to keep her next visit at the agreed upon time to discuss these results.  Objective:   Blood pressure 136/84, pulse 79, temperature 98.2 F (36.8 C), height  (1.6 m), weight 270 lb (122.5 kg), SpO2 99 %. Body mass index is 47.83 kg/m.  EKG: Normal sinus rhythm, rate 80 BPM.  Indirect Calorimeter completed today shows a VO2 of 366 and a REE of 2534.  Her calculated basal metabolic rate is 1610 thus her basal metabolic rate is better than expected.  General: Cooperative, alert, well developed, in no acute distress. HEENT: Conjunctivae and lids unremarkable. Cardiovascular: Regular rhythm.  Lungs: Normal work of breathing. Neurologic: No focal deficits.   Lab Results  Component Value Date   CREATININE 1.12 (H) 08/11/2022   BUN 22 08/11/2022   NA 134 08/11/2022   K 4.4 08/11/2022   CL 97 08/11/2022   CO2 23 08/11/2022   Lab Results  Component Value Date   ALT 38 (H) 08/11/2022   AST 60 (H) 08/11/2022   ALKPHOS 49 08/11/2022   BILITOT 0.4 08/11/2022   Lab Results  Component Value Date   HGBA1C 5.6 08/11/2022   Lab Results  Component Value Date   INSULIN 27.7 (H) 08/11/2022  Lab Results  Component Value Date   TSH 24.000 (H) 08/11/2022   Lab Results  Component Value Date   CHOL 148 08/11/2022   HDL 26 (L) 08/11/2022   LDLCALC 90 08/11/2022   TRIG 185 (H) 08/11/2022   Lab Results  Component Value Date   WBC 4.7 08/11/2022   HGB 11.9 08/11/2022   HCT 34.2 08/11/2022   MCV 90 08/11/2022   PLT 199 08/11/2022   No results found for: "IRON", "TIBC", "FERRITIN"  Attestation Statements:   Reviewed by clinician on day of visit: allergies, medications, problem list, medical history, surgical history, family history, social history, and previous encounter notes.   Trude Mcburney, am acting as Energy manager for [employer], DO.  I have reviewed the above documentation for accuracy and completeness, and I  agree with the above. Corinna Capra, DO

## 2022-08-18 DIAGNOSIS — D518 Other vitamin B12 deficiency anemias: Secondary | ICD-10-CM | POA: Diagnosis not present

## 2022-08-18 DIAGNOSIS — J302 Other seasonal allergic rhinitis: Secondary | ICD-10-CM | POA: Diagnosis not present

## 2022-08-18 DIAGNOSIS — G2581 Restless legs syndrome: Secondary | ICD-10-CM | POA: Diagnosis not present

## 2022-08-18 DIAGNOSIS — F411 Generalized anxiety disorder: Secondary | ICD-10-CM | POA: Diagnosis not present

## 2022-08-18 DIAGNOSIS — G4701 Insomnia due to medical condition: Secondary | ICD-10-CM | POA: Diagnosis not present

## 2022-08-18 DIAGNOSIS — M62830 Muscle spasm of back: Secondary | ICD-10-CM | POA: Diagnosis not present

## 2022-08-18 DIAGNOSIS — E782 Mixed hyperlipidemia: Secondary | ICD-10-CM | POA: Diagnosis not present

## 2022-08-18 DIAGNOSIS — E038 Other specified hypothyroidism: Secondary | ICD-10-CM | POA: Diagnosis not present

## 2022-08-18 DIAGNOSIS — E559 Vitamin D deficiency, unspecified: Secondary | ICD-10-CM | POA: Diagnosis not present

## 2022-08-18 DIAGNOSIS — H673 Otitis media in diseases classified elsewhere, bilateral: Secondary | ICD-10-CM | POA: Diagnosis not present

## 2022-08-25 ENCOUNTER — Ambulatory Visit: Payer: Medicaid Other | Admitting: Nurse Practitioner

## 2022-08-25 ENCOUNTER — Encounter: Payer: Self-pay | Admitting: Nurse Practitioner

## 2022-08-25 VITALS — BP 134/82 | HR 82 | Ht 63.0 in | Wt 264.0 lb

## 2022-08-25 DIAGNOSIS — Z6841 Body Mass Index (BMI) 40.0 and over, adult: Secondary | ICD-10-CM

## 2022-08-25 DIAGNOSIS — E038 Other specified hypothyroidism: Secondary | ICD-10-CM

## 2022-08-25 DIAGNOSIS — E88819 Insulin resistance, unspecified: Secondary | ICD-10-CM | POA: Diagnosis not present

## 2022-08-25 DIAGNOSIS — E559 Vitamin D deficiency, unspecified: Secondary | ICD-10-CM | POA: Diagnosis not present

## 2022-08-25 DIAGNOSIS — R748 Abnormal levels of other serum enzymes: Secondary | ICD-10-CM

## 2022-08-25 DIAGNOSIS — E7800 Pure hypercholesterolemia, unspecified: Secondary | ICD-10-CM

## 2022-08-25 NOTE — Progress Notes (Signed)
Office: [phone]  /  Fax: [phone]  WEIGHT SUMMARY AND BIOMETRICS  Weight Lost Since Last Visit: 6 lb  No data recorded  Vitals BP: 134/82 Pulse Rate: 82 SpO2: 99 %   Anthropometric Measurements Height: 5\' 3"  (1.6 m) Weight: 264 lb (119.7 kg) BMI (Calculated): 46.78 Weight at Last Visit: 270 lb Weight Lost Since Last Visit: 6 lb Starting Weight: 270 lb Total Weight Loss (lbs): 6 lb (2.722 kg)   Body Composition  Body Fat %: 51 % Fat Mass (lbs): 135 lbs Muscle Mass (lbs): 123 lbs Total Body Water (lbs): 93.6 lbs Visceral Fat Rating : 18   Other Clinical Data Fasting: No Labs: No Today's Visit #: 2 Starting Date: 08/11/22     HPI  Chief Complaint: OBESITY  Ebony Ortiz is here to discuss her progress with her obesity treatment plan. She is on the the Category 4 Plan and states she is following her eating plan approximately 60 % of the time. She states she is not exercising.   Interval History:  Since last office visit she has lost 6 pounds.  She is struggling with eating all the food on her meal plan due to Celiac disease. She has been very used to eating 1000 calories per day.  She can't eat any bread products due to the severity of her celiac disease.  She is drinking water daily.   She notes she doesn't have time for exercise.  Denies hunger or cravings.    She is using myfitness pal app:  She is weighing and measuring her foods.  [id number]  calories 190-206 protein 130-156 carbs 43-71 fats   Pharmacotherapy for weight loss: She is not currently taking medications  for medical weight loss.   Previous pharmacotherapy for medical weight loss:  never  Bariatric surgery:  Patient has not had bariatric surgery.    Hypothyroidism Taking Synthroid .  Has appt with endo on 11/14/22  Lab Results  Component Value Date   TSH 24.000 (H) 08/11/2022    Vit D deficiency  She is taking Vit D 50,000 IU weekly.  Denies side effects.  Denies nausea,  vomiting or muscle weakness.    Lab Results  Component Value Date   VD25OH 29.7 (L) 08/11/2022    Elevated liver enzymes Last labs 08/11/22   Denies alcohol or OTC meds Denies abd pain No history of fatty liver  Hyperlipidemia Medication(s): Tricor 145 mg. Denies side effects.    Lab Results  Component Value Date   CHOL 148 08/11/2022   HDL 26 (L) 08/11/2022   LDLCALC 90 08/11/2022   TRIG 185 (H) 08/11/2022   Lab Results  Component Value Date   ALT 38 (H) 08/11/2022   AST 60 (H) 08/11/2022   ALKPHOS 49 08/11/2022   BILITOT 0.4 08/11/2022   The 10-year ASCVD risk score (Arnett DK, et al., 2019) is: 2.8%   Values used to calculate the score:     Age: 52 years     Sex: Female     Is Non-Hispanic African American: No     Diabetic: No     Tobacco smoker: No     Systolic Blood Pressure: 134 mmHg     Is BP treated: No     HDL Cholesterol: 26 mg/dL     Total Cholesterol: 148 mg/dL   Insulin Resistance Last fasting insulin was 27.7. A1c was 5.6. Not currently on meds Denies polyphagia   Lab Results  Component Value Date   HGBA1C 5.6 08/11/2022  Lab Results  Component Value Date   INSULIN 27.7 (H) 08/11/2022    PHYSICAL EXAM:  Blood pressure 134/82, pulse 82, height  (1.6 m), weight 264 lb (119.7 kg), SpO2 99 %. Body mass index is 46.77 kg/m.  General: She is overweight, cooperative, alert, well developed, and in no acute distress. PSYCH: Has normal mood, affect and thought process.   Extremities: No edema.  Neurologic: No gross sensory or motor deficits. No tremors or fasciculations noted.    DIAGNOSTIC DATA REVIEWED:  BMET    Component Value Date/Time   NA 134 08/11/2022 1021   K 4.4 08/11/2022 1021   CL 97 08/11/2022 1021   CO2 23 08/11/2022 1021   GLUCOSE 91 08/11/2022 1021   BUN 22 08/11/2022 1021   CREATININE 1.12 (H) 08/11/2022 1021   CALCIUM 9.6 08/11/2022 1021   Lab Results  Component Value Date   HGBA1C 5.6 08/11/2022   Lab  Results  Component Value Date   INSULIN 27.7 (H) 08/11/2022   Lab Results  Component Value Date   TSH 24.000 (H) 08/11/2022   CBC    Component Value Date/Time   WBC 4.7 08/11/2022 1021   RBC 3.79 08/11/2022 1021   HGB 11.9 08/11/2022 1021   HCT 34.2 08/11/2022 1021   PLT 199 08/11/2022 1021   MCV 90 08/11/2022 1021   MCH 31.4 08/11/2022 1021   MCHC 34.8 08/11/2022 1021   RDW 12.4 08/11/2022 1021   Iron Studies No results found for: "IRON", "TIBC", "FERRITIN", "IRONPCTSAT" Lipid Panel     Component Value Date/Time   CHOL 148 08/11/2022 1021   TRIG 185 (H) 08/11/2022 1021   HDL 26 (L) 08/11/2022 1021   LDLCALC 90 08/11/2022 1021   Hepatic Function Panel     Component Value Date/Time   PROT 8.0 08/11/2022 1021   ALBUMIN 4.5 08/11/2022 1021   AST 60 (H) 08/11/2022 1021   ALT 38 (H) 08/11/2022 1021   ALKPHOS 49 08/11/2022 1021   BILITOT 0.4 08/11/2022 1021      Component Value Date/Time   TSH 24.000 (H) 08/11/2022 1021   Nutritional Lab Results  Component Value Date   VD25OH 29.7 (L) 08/11/2022     ASSESSMENT AND PLAN  TREATMENT PLAN FOR OBESITY:  Recommended Dietary Goals  Female is currently in the action stage of change. As such, her goal is to continue weight management plan. She has agreed to start a low carb plan and track using myfitness pal-aim for 1800 calories and 100+ grams of protein.  Behavioral Intervention  We discussed the following Behavioral Modification Strategies today: increasing lean protein intake, decreasing simple carbohydrates , increasing vegetables, increasing lower glycemic fruits, increasing water intake, continue to practice mindfulness when eating, and planning for success.  Additional resources provided today: NA  Recommended Physical Activity Goals  [name] has been advised to work up to 150 minutes of moderate intensity aerobic activity a week and strengthening exercises 2-3 times per week for cardiovascular health, weight  loss maintenance and preservation of muscle mass.   She has agreed to Think about ways to increase physical activity, Increase physical activity in their day and reduce sedentary time (increase NEAT)., and Work on scheduling and tracking physical activity.    ASSOCIATED CONDITIONS ADDRESSED TODAY  Action/Plan  Other specified hypothyroidism Continue Synthroid as directed. Contacted Endo, wasn't able to get  her a sooner appt.  Keep appt scheduled.  Will recheck TSH in 4 weeks  Vitamin D deficiency Continue Vit  D as directed.    Low Vitamin D level contributes to fatigue and are associated with obesity, breast, and colon cancer. She agrees to continue to take prescription Vitamin D ,000 IU every week and will follow-up for routine testing of Vitamin D, at least 2-3 times per year to avoid over-replacement.   Elevated liver enzymes Will recheck labs in 2-4 weeks.   Elevated cholesterol Continue to follow up with PCP.  Continue meds as directed.   Cardiovascular risk and specific lipid/LDL goals reviewed.  We discussed several lifestyle modifications today and Ayah will continue to work on diet, exercise and weight loss efforts. Orders and follow up as documented in patient record.   Counseling Intensive lifestyle modifications are the first line treatment for this issue. Dietary changes: Increase soluble fiber. Decrease simple carbohydrates. Exercise changes: Moderate to vigorous-intensity aerobic activity 150 minutes per week if tolerated. Lipid-lowering medications: see documented in medical record.   Insulin resistance Sharronda will continue to work on weight loss, exercise, and decreasing simple carbohydrates to help decrease the risk of diabetes. Davan agreed to follow-up with [place] as directed to closely monitor her progress.   Morbid obesity with body mass index (BMI) of 45.0 to 49.9 in adult  BMI 45.0-49.9, adult         Return in about 2 weeks (around 09/08/2022).Marland Kitchen She was  informed of the importance of frequent follow up visits to maximize her success with intensive lifestyle modifications for her multiple health conditions.   ATTESTASTION STATEMENTS:  Reviewed by clinician on day of visit: allergies, medications, problem list, medical history, surgical history, family history, social history, and previous encounter notes.   Time spent on visit including pre-visit chart review and post-visit care and charting was 40 minutes.    Theodis Sato. Endya Austin FNP-C

## 2022-08-29 ENCOUNTER — Ambulatory Visit
Admission: RE | Admit: 2022-08-29 | Discharge: 2022-08-29 | Disposition: A | Discharge status: Home / Self Care | Payer: Medicaid Other | Source: Ambulatory Visit | Attending: Family | Admitting: Family

## 2022-08-29 DIAGNOSIS — Z1231 Encounter for screening mammogram for malignant neoplasm of breast: Secondary | ICD-10-CM | POA: Diagnosis not present

## 2022-09-12 ENCOUNTER — Ambulatory Visit (INDEPENDENT_AMBULATORY_CARE_PROVIDER_SITE_OTHER): Payer: Medicaid Other | Admitting: Adult Health

## 2022-09-12 ENCOUNTER — Encounter (INDEPENDENT_AMBULATORY_CARE_PROVIDER_SITE_OTHER): Payer: Self-pay | Admitting: Adult Health

## 2022-09-12 VITALS — BP 130/78 | HR 88 | Temp 98.4°F | Ht 63.0 in | Wt 266.0 lb

## 2022-09-12 DIAGNOSIS — E038 Other specified hypothyroidism: Secondary | ICD-10-CM

## 2022-09-12 DIAGNOSIS — E88819 Insulin resistance, unspecified: Secondary | ICD-10-CM | POA: Diagnosis not present

## 2022-09-12 DIAGNOSIS — Z6841 Body Mass Index (BMI) 40.0 and over, adult: Secondary | ICD-10-CM | POA: Diagnosis not present

## 2022-09-12 NOTE — Progress Notes (Signed)
WEIGHT SUMMARY AND BIOMETRICS  Vitals Temp: 98.4 F (36.9 C) BP: 130/78 Pulse Rate: 88 SpO2: 98 %   Anthropometric Measurements Height: 5\' 3"  (1.6 m) Weight: 266 lb (120.7 kg) BMI (Calculated): 47.13 Weight at Last Visit: 264lb Weight Lost Since Last Visit: 0 Weight Gained Since Last Visit: 2lb Starting Weight: 266lb Total Weight Loss (lbs): 4 lb (1.814 kg)   Body Composition  Body Fat %: 52.4 % Fat Mass (lbs): 139.4 lbs Muscle Mass (lbs): 120.2 lbs Total Body Water (lbs): 95.6 lbs Visceral Fat Rating : 18   Other Clinical Data Fasting: no Labs: no Today's Visit #: 3 Starting Date: 08/11/22    Chief Complaint:   OBESITY Ebony Ortiz is here to discuss her progress with her obesity treatment plan. She is on the the Category 4 Plan and states she is following her eating plan approximately 100 % of the time. She states she is not currently exercising due to demanding work schedule.    Interim History:  Ebony Ortiz has 2 initial HWW at [facility] clinic, would like to be seen at [facility] office moving forward as it closer to her home in [place].  Hunger/appetite-She denies polyphagia.  She has been tracking her food intake for decades.  Stress- Ebony Ortiz endorse constant work stress. 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 Sleep- is often interrupted when fielding calls from customers and drivers "in the field". Exercise- she is found of exercise and in years past would exercise at high intensity- walking, weight training.  Due to demanding work schedule- unable to commit time to regular exercise regime.   08/11/22 08:00  RMR [id number]   Reviewed Bioimpedance results with pt: Muscle Mass: - 2.8 lbs Adipose Mass: + 4.4 lbs  Subjective:   1. Insulin resistance   Latest Reference Range & Units 08/11/22 10:21  INSULIN 2.6 - 24.9 uIU/mL 27.7 (H)  (H): Data is abnormally high She is not  currently on any antidiabetic medications. She denies polyphagia and often finds it difficult to consume all prescribed calories on Cat 4 Meal Plan.  2. Other specified hypothyroidism  Latest Reference Range & Units 08/11/22 10:21  TSH 0.450 - 4.500 uIU/mL 24.000 (H)  (H): Data is abnormally high She is on daily Levothyroxine She denies palpitations, hot/cold intolerances, increased anxiety Endo OV on 11/14/22, she is also on Cancellation List in an attempt to be seen sooner  Assessment/Plan:   1. Insulin resistance Reduce sugar/CHO and continue to increase protein intake.  2. Other specified hypothyroidism Continue  levothyroxine (SYNTHROID) 150 MCG tablet Take 1 tablet (150 mcg total) by mouth daily before breakfast. Dispense: 30 tablet, Refills: 1 ordered   3. Morbid obesity with body mass index (BMI) of 45.0 to 49.9 in adult [facility])  Ebony Ortiz is currently in the action stage of change. As such, her goal is to continue with weight loss efforts. She has agreed to the Category 4 Plan.   Exercise goals: All adults should avoid inactivity. Some physical activity is better than none, and adults who participate in any amount of physical activity gain some health benefits.  Behavioral modification strategies: increasing lean protein intake, decreasing simple carbohydrates, increasing vegetables, increasing water intake, decreasing eating out, no skipping meals, and planning for success.  Ebony Ortiz has agreed to follow-up with our clinic in 2 weeks. She was informed of the importance of frequent follow-up visits to maximize her success with  intensive lifestyle modifications for her multiple health conditions.   Objective:   Blood pressure 130/78, pulse 88, temperature 98.4 F (36.9 C), height 5\' 3"  (1.6 m), weight 266 lb (120.7 kg), SpO2 98 %. Body mass index is 47.12 kg/m.  General: Cooperative, alert, well developed, in no acute distress. HEENT: Conjunctivae and lids  unremarkable. Cardiovascular: Regular rhythm.  Lungs: Normal work of breathing. Neurologic: No focal deficits.   Lab Results  Component Value Date   CREATININE 1.12 (H) 08/11/2022   BUN 22 08/11/2022   NA 134 08/11/2022   K 4.4 08/11/2022   CL 97 08/11/2022   CO2 23 08/11/2022   Lab Results  Component Value Date   ALT 38 (H) 08/11/2022   AST 60 (H) 08/11/2022   ALKPHOS 49 08/11/2022   BILITOT 0.4 08/11/2022   Lab Results  Component Value Date   HGBA1C 5.6 08/11/2022   Lab Results  Component Value Date   INSULIN 27.7 (H) 08/11/2022   Lab Results  Component Value Date   TSH 24.000 (H) 08/11/2022   Lab Results  Component Value Date   CHOL 148 08/11/2022   HDL 26 (L) 08/11/2022   LDLCALC 90 08/11/2022   TRIG 185 (H) 08/11/2022   Lab Results  Component Value Date   VD25OH 29.7 (L) 08/11/2022   Lab Results  Component Value Date   WBC 4.7 08/11/2022   HGB 11.9 08/11/2022   HCT 34.2 08/11/2022   MCV 90 08/11/2022   PLT 199 08/11/2022   No results found for: "IRON", "TIBC", "FERRITIN"  Attestation Statements:   Reviewed by clinician on day of visit: allergies, medications, problem list, medical history, surgical history, family history, social history, and previous encounter notes.  Time spent on visit including pre-visit chart review and post-visit care and charting was 28 minutes.   I have reviewed the above documentation for accuracy and completeness, and I agree with the above. -  Tinisha Etzkorn d. Bellatrix Devonshire, NP-C

## 2022-09-22 DIAGNOSIS — E039 Hypothyroidism, unspecified: Secondary | ICD-10-CM | POA: Insufficient documentation

## 2022-09-22 DIAGNOSIS — R635 Abnormal weight gain: Secondary | ICD-10-CM | POA: Insufficient documentation

## 2022-09-27 ENCOUNTER — Ambulatory Visit (INDEPENDENT_AMBULATORY_CARE_PROVIDER_SITE_OTHER): Payer: Medicaid Other | Admitting: Adult Health

## 2022-10-03 ENCOUNTER — Encounter (INDEPENDENT_AMBULATORY_CARE_PROVIDER_SITE_OTHER): Payer: Self-pay | Admitting: Adult Health

## 2022-10-03 ENCOUNTER — Ambulatory Visit (INDEPENDENT_AMBULATORY_CARE_PROVIDER_SITE_OTHER): Payer: Medicaid Other | Admitting: Adult Health

## 2022-10-03 VITALS — BP 134/82 | HR 92 | Temp 98.2°F | Ht 63.0 in | Wt 269.0 lb

## 2022-10-03 DIAGNOSIS — E038 Other specified hypothyroidism: Secondary | ICD-10-CM | POA: Diagnosis not present

## 2022-10-03 DIAGNOSIS — Z6841 Body Mass Index (BMI) 40.0 and over, adult: Secondary | ICD-10-CM

## 2022-10-03 DIAGNOSIS — E559 Vitamin D deficiency, unspecified: Secondary | ICD-10-CM | POA: Diagnosis not present

## 2022-10-03 DIAGNOSIS — E88819 Insulin resistance, unspecified: Secondary | ICD-10-CM

## 2022-10-03 MED ORDER — VITAMIN D (ERGOCALCIFEROL) 1.25 MG (50000 UNIT) PO CAPS: 50000.0000 [IU] | ORAL_CAPSULE | ORAL | 0 refills | Status: DC

## 2022-10-03 NOTE — Progress Notes (Signed)
WEIGHT SUMMARY AND BIOMETRICS  Vitals Temp: 98.2 F (36.8 C) BP: 134/82 Pulse Rate: 92 SpO2: 95 %   Anthropometric Measurements Height: 5\' 3"  (1.6 m) Weight: 269 lb (122 kg) BMI (Calculated): 47.66 Weight at Last Visit: 266lb Weight Lost Since Last Visit: 0 Weight Gained Since Last Visit: 3lb Starting Weight: 266lb Total Weight Loss (lbs): 1 lb (0.454 kg)   Body Composition  Body Fat %: 52.9 % Fat Mass (lbs): 142.4 lbs Muscle Mass (lbs): 120.4 lbs Total Body Water (lbs): 96.2 lbs Visceral Fat Rating : 19   Other Clinical Data Fasting: no Labs: no Today's Visit #: 4 Starting Date: 08/11/22    Chief Complaint:   OBESITY Ebony Ortiz is here to discuss her progress with her obesity treatment plan. She is on the the Category 4 Plan and states she is following her eating plan approximately 50 % of the time. She states she is exercising Walking 20-30 minutes 2 times per week.   Interim History:  She went "over the road" with the husband in the truck to [place]. On the road for 6 days. She was challenged to eat on plan, drink enough water or walk daily.  While on trip- Endocrinology called to get her earlier to establish as new pt- labs completed and [place] ordered.  Subjective:   1. Insulin resistance  Latest Reference Range & Units 08/11/22 10:21  Glucose 70 - 99 mg/dL 91  Hemoglobin Z6X 4.8 - 5.6 % 5.6  Est. average glucose Bld gHb Est-mCnc mg/dL 096  INSULIN 2.6 - 04.5 uIU/mL 27.7 (H)  (H): Data is abnormally high Due to recent travel she was challenged to consume all her prescribed protein.  2. Vitamin D deficiency  Latest Reference Range & Units 08/11/22 10:21  Vitamin D, 25-Hydroxy 30.0 - 100.0 ng/mL 29.7 (L)  (L): Data is abnormally low She has stopped all OTC supplements. She endorses fatigue  3. Other specified hypothyroidism  Latest Reference Range & Units 08/11/22 10:21  TSH 0.450 - 4.500 uIU/mL 24.000 (H)  (H): Data is abnormally high 09/22/2022  Initial OV with Dr. Patel/Endocrinology notes: HPI: Patient is 52 y.o. year old female who is referred for evaluation and management of hypothyroidism. Patient reports that she was diagnosed with hypothyroidism approximately 10 to 15 years ago. Patient was followed by endocrinologist in [place]. Patient moved to [place] 3 years ago. Patient was followed by primary doctor. Patient had some changes in the primary doctor and the dose of levothyroxine was decreased to 1 tablet every other day last year. After that patient had significant worsening of symptoms. Patient is complaining of fatigue. Patient also has dryness of skin. Patient reports significant weight gain. Patient complains of some insomnia. Patient was found to have elevated TSH of 24 in April. The dose of levothyroxine was increased to 150 mcg daily after that. Previous dose was 125 mcg daily. Patient reports that she still continues to have significant above-mentioned symptoms. Patient denies any tremors or palpitations. Patient denies any swallowing problems or hoarseness of voice. Patient denies any history of radiation to the neck area.  Current Outpatient Medications:  dexamethasone (DECADRON) 1 mg tablet, Take 1 tablet at bedtime prior to having the labs next day on empty stomach., Disp: 1 tablet, Rfl: 0 levothyroxine (Synthroid) 150 mcg tablet, Take 1 tablet (150 mcg total) by mouth every morning., Disp:  ASSESSMENT/PLAN: 1. Abnormal results of thyroid function studies - Ambulatory referral to Endocrinology  2. Acquired hypothyroidism I had a  detailed discussion about pathophysiology of thyroid hormone and about different symptoms of hypothyroidism with the patient. I discussed with the patient about different thyroid hormones including T4 and T3 and differences between them. I discussed with the patient about further management. I will repeat the thyroid hormone levels with the TSH, free T4 and T3 and adjust the dose of  levothyroxine.  I will also arrange for thyroid ultrasound since the left lobe of the thyroid gland appears to be fuller and larger than the right lobe. - TSH; Future - T4, Free; Future - T3, Free; Future - US Soft Tissue Head And Or Neck; Future  3. Weight gain Discussed with the patient about other etiologies of weight gain. I will rule out Cushing syndrome by doing overnight dexamethasone suppression test. Further management will be depend upon the blood test results. - Cortisol; Future   Assessment/Plan:   1. Insulin resistance Increase protein and increase walking.  2. Vitamin D deficiency Cleaned up MAR- removed all supplements that she has stopped last month. Start  Vitamin D, Ergocalciferol, (DRISDOL) 1.25 MG (50000 UNIT) CAPS capsule Take 1 capsule (50,000 Units total) by mouth every 7 (seven) days. Dispense: 12 capsule, Refills: 0 ordered    3. Other specified hypothyroidism 09/27/2022 T3, Free 2.58   T4, Free 0.8   TSH 2.150   Cortisol 1.0   4. Morbid obesity with body mass index (BMI) of 45.0 to 49.9 in adult [facility]), Current BMI 47.66  Ebony Ortiz is currently in the action stage of change. As such, her goal is to continue with weight loss efforts. She has agreed to the Category 4 Plan.   Exercise goals: All adults should avoid inactivity. Some physical activity is better than none, and adults who participate in any amount of physical activity gain some health benefits.  Behavioral modification strategies: increasing lean protein intake, decreasing simple carbohydrates, increasing vegetables, increasing water intake, no skipping meals, meal planning and cooking strategies, and planning for success.  Tarea has agreed to follow-up with our clinic in 3 weeks. She was informed of the importance of frequent follow-up visits to maximize her success with intensive lifestyle modifications for her multiple health conditions.   Objective:   Blood pressure 134/82, pulse 92,  temperature 98.2 F (36.8 C), height 5\' 3"  (1.6 m), weight 269 lb (122 kg), SpO2 95 %. Body mass index is 47.65 kg/m.  General: Cooperative, alert, well developed, in no acute distress. HEENT: Conjunctivae and lids unremarkable. Cardiovascular: Regular rhythm.  Lungs: Normal work of breathing. Neurologic: No focal deficits.   Lab Results  Component Value Date   CREATININE 1.12 (H) 08/11/2022   BUN 22 08/11/2022   NA 134 08/11/2022   K 4.4 08/11/2022   CL 97 08/11/2022   CO2 23 08/11/2022   Lab Results  Component Value Date   ALT 38 (H) 08/11/2022   AST 60 (H) 08/11/2022   ALKPHOS 49 08/11/2022   BILITOT 0.4 08/11/2022   Lab Results  Component Value Date   HGBA1C 5.6 08/11/2022   Lab Results  Component Value Date   INSULIN 27.7 (H) 08/11/2022   Lab Results  Component Value Date   TSH 24.000 (H) 08/11/2022   Lab Results  Component Value Date   CHOL 148 08/11/2022   HDL 26 (L) 08/11/2022   LDLCALC 90 08/11/2022   TRIG 185 (H) 08/11/2022   Lab Results  Component Value Date   VD25OH 29.7 (L) 08/11/2022   Lab Results  Component Value Date  WBC 4.7 08/11/2022   HGB 11.9 08/11/2022   HCT 34.2 08/11/2022   MCV 90 08/11/2022   PLT 199 08/11/2022   No results found for: "IRON", "TIBC", "FERRITIN"  Attestation Statements:   Reviewed by clinician on day of visit: allergies, medications, problem list, medical history, surgical history, family history, social history, and previous encounter notes.  I have reviewed the above documentation for accuracy and completeness, and I agree with the above. -  Daeveon Zweber d. Hagen Bohorquez, NP-C

## 2022-10-04 ENCOUNTER — Telehealth (INDEPENDENT_AMBULATORY_CARE_PROVIDER_SITE_OTHER): Payer: Medicaid Other | Admitting: Psychology

## 2022-10-04 IMAGING — MG MM DIGITAL SCREENING BILAT W/ TOMO AND CAD
6 of 10 series · 6 of 30 positions shown · non-contrast
Comparison: Previous exam(s).

CLINICAL DATA: Screening.

EXAM:
DIGITAL SCREENING BILATERAL MAMMOGRAM WITH TOMO AND CAD

[R MLO synth-2D (1 of 2)]
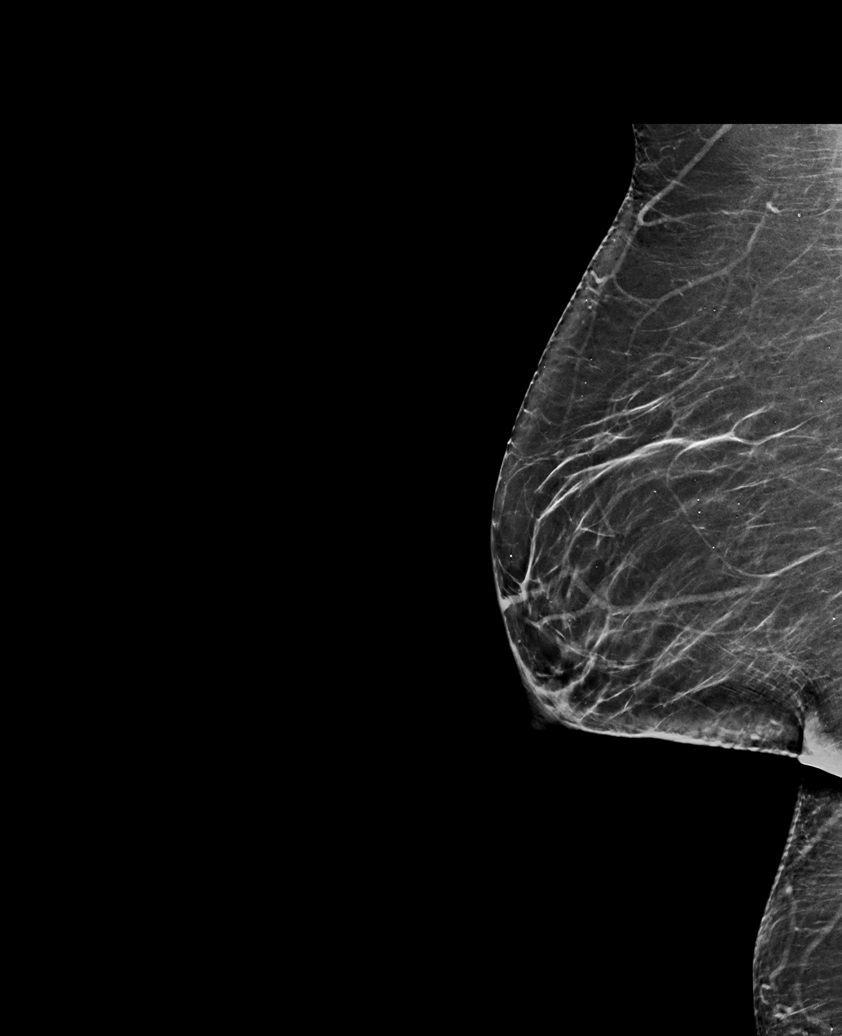

[R MLO synth-2D (2 of 2)]
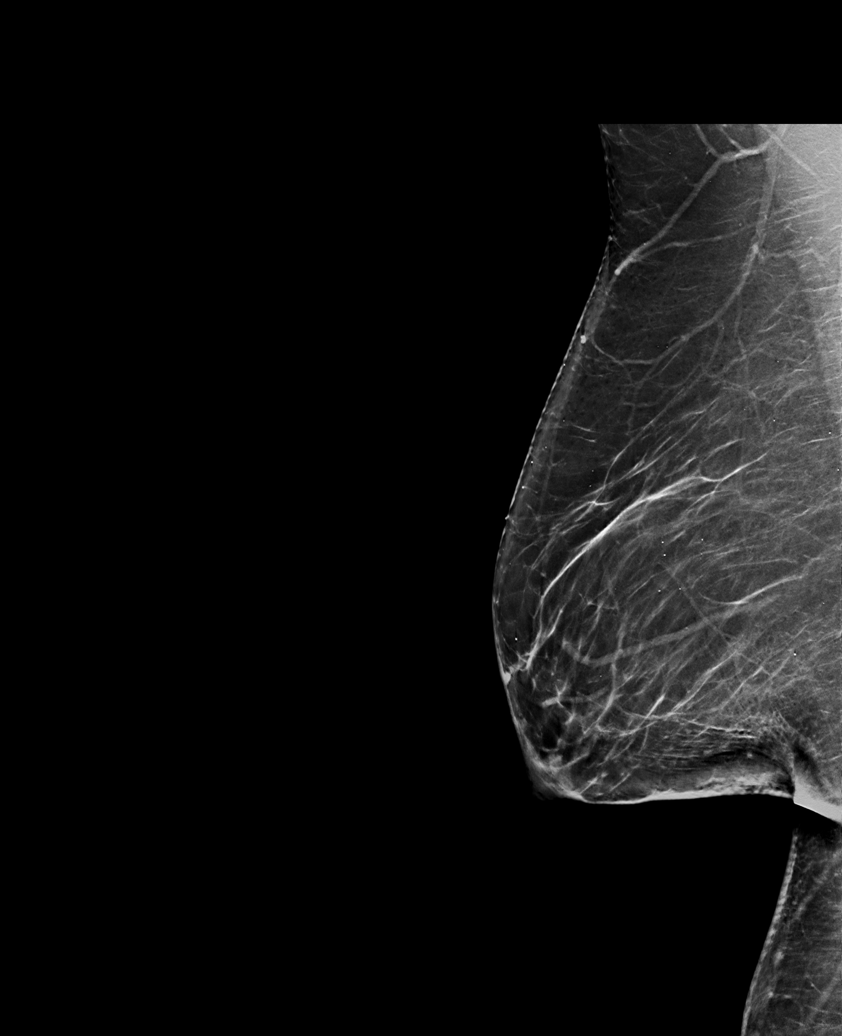

[L CC synth-2D]
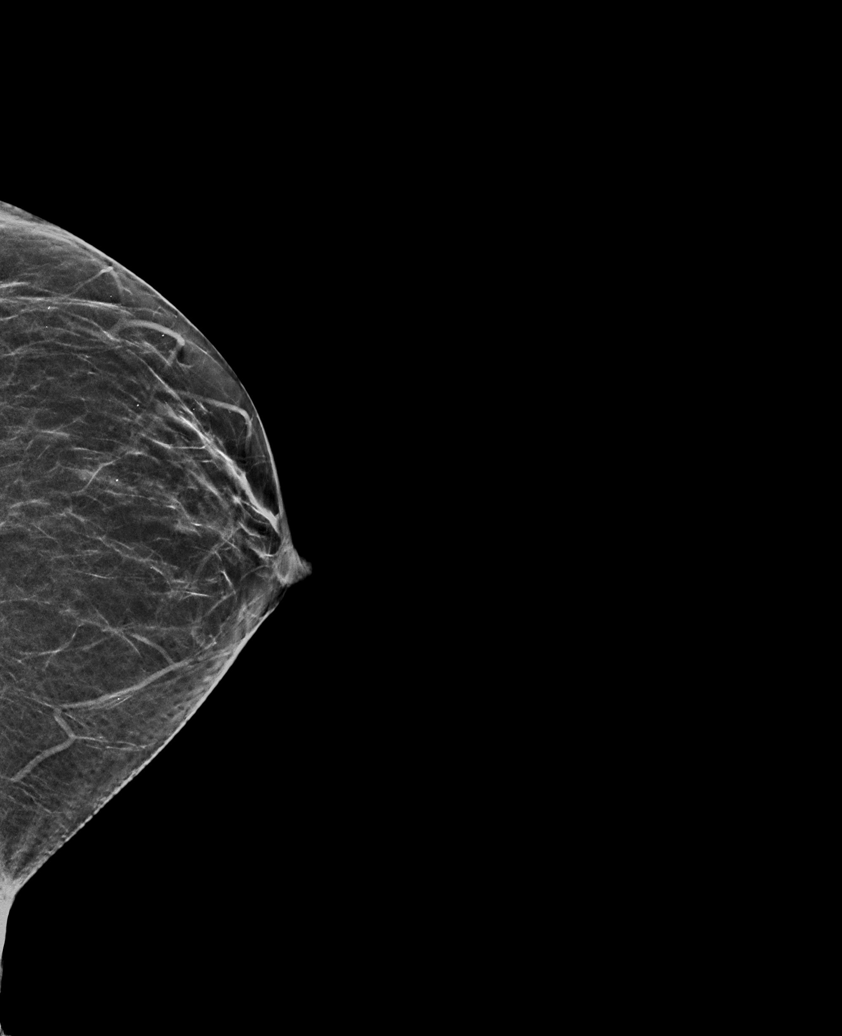

[L MLO synth-2D]
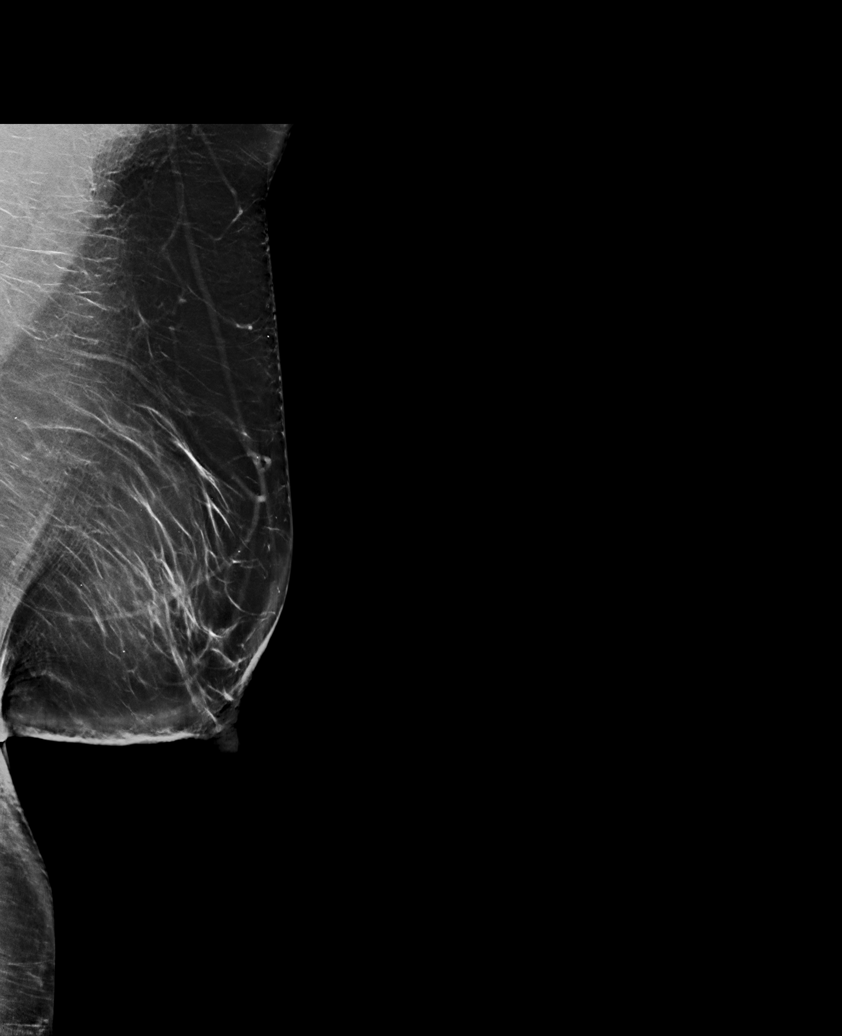

[R CC synth-2D]
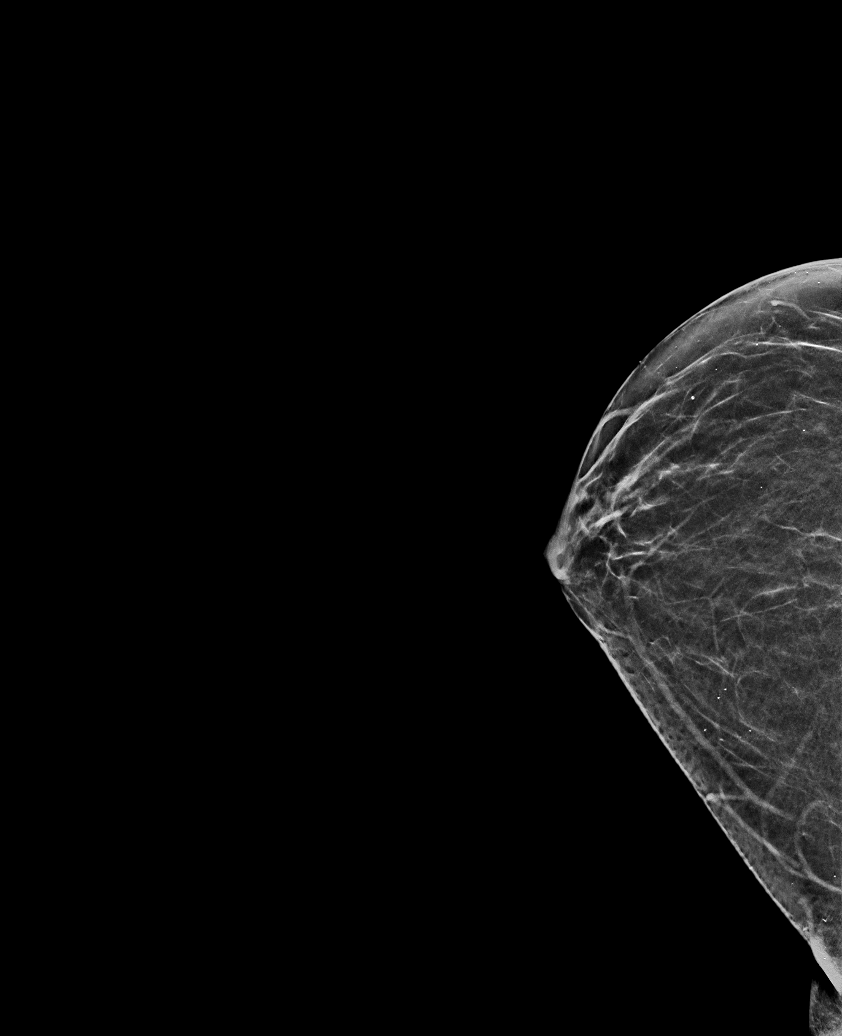

[R CC tomo · tomo slice 25/50.0]
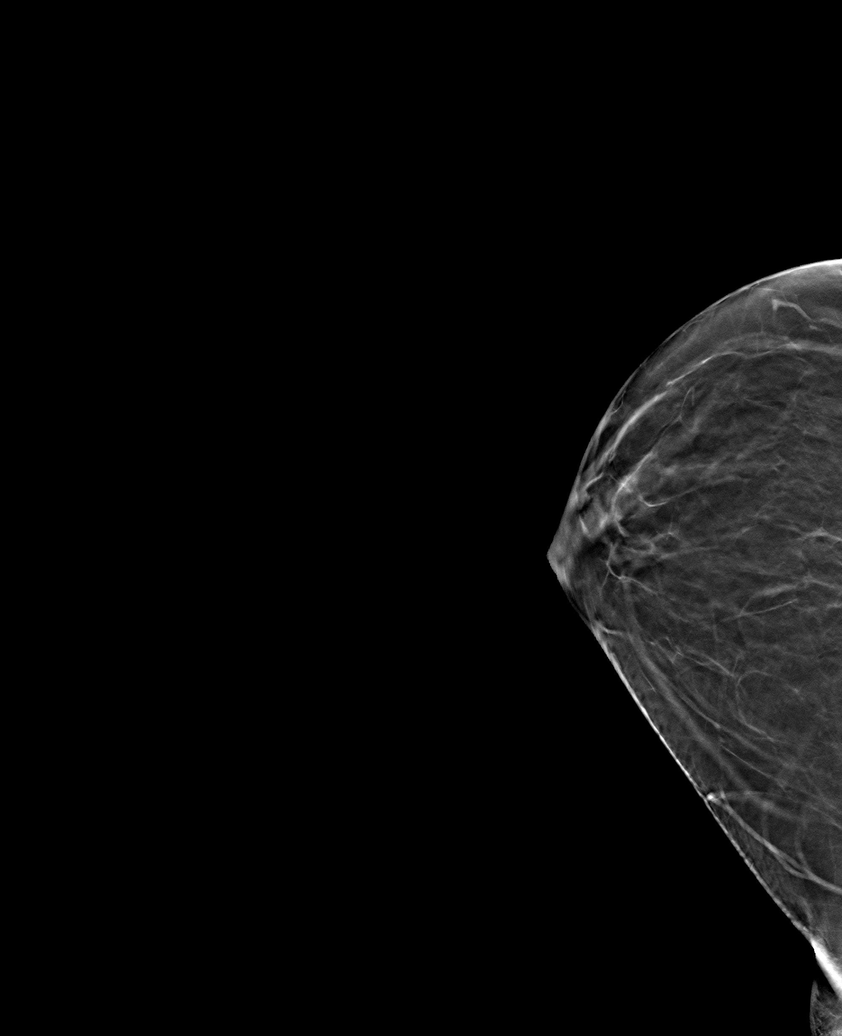

[6 of 30 positions shown; findings below may reference images not displayed]

ACR Breast Density Category b: There are scattered areas of
fibroglandular density.
FINDINGS: In the left breast, a possible asymmetry on the CC view warrants
further evaluation. In the right breast, no findings suspicious for
malignancy. The images were evaluated with computer-aided detection.
IMPRESSION: Further evaluation is suggested for possible asymmetry in the left
breast.

RECOMMENDATION:
Diagnostic mammogram and possibly ultrasound of the left breast.
(Code:[id number])

The patient will be contacted regarding the findings, and additional
imaging will be scheduled.

BI-RADS CATEGORY  0: Incomplete. Need additional imaging evaluation
and/or prior mammograms for comparison.

## 2022-11-05 IMAGING — MG MM DIGITAL DIAGNOSTIC UNILAT*L* W/ TOMO W/ CAD
4 series · 4 of 12 positions shown · non-contrast
Comparison: Previous exams including recent screening mammogram
dated 05/21/2020.

CLINICAL DATA: Patient returns today to evaluate a possible LEFT
breast asymmetry questioned on recent screening mammogram.

EXAM:
DIGITAL DIAGNOSTIC UNILATERAL LEFT MAMMOGRAM WITH TOMOSYNTHESIS AND
CAD
TECHNIQUE: Left digital diagnostic mammography and breast tomosynthesis was
performed. The images were evaluated with computer-aided detection.

[L ML synth-2D]
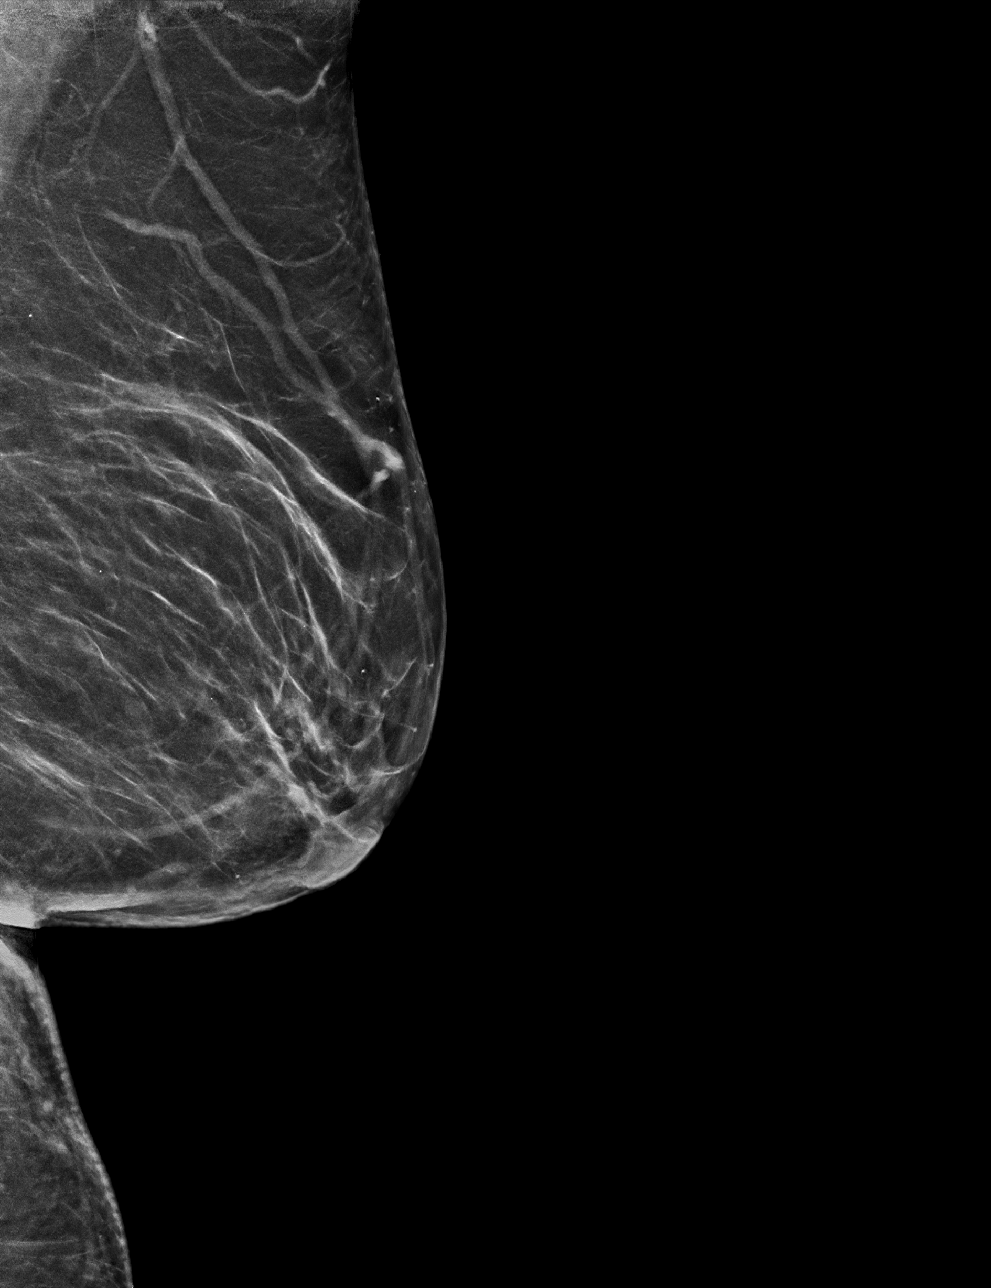

[L CC synth-2D]
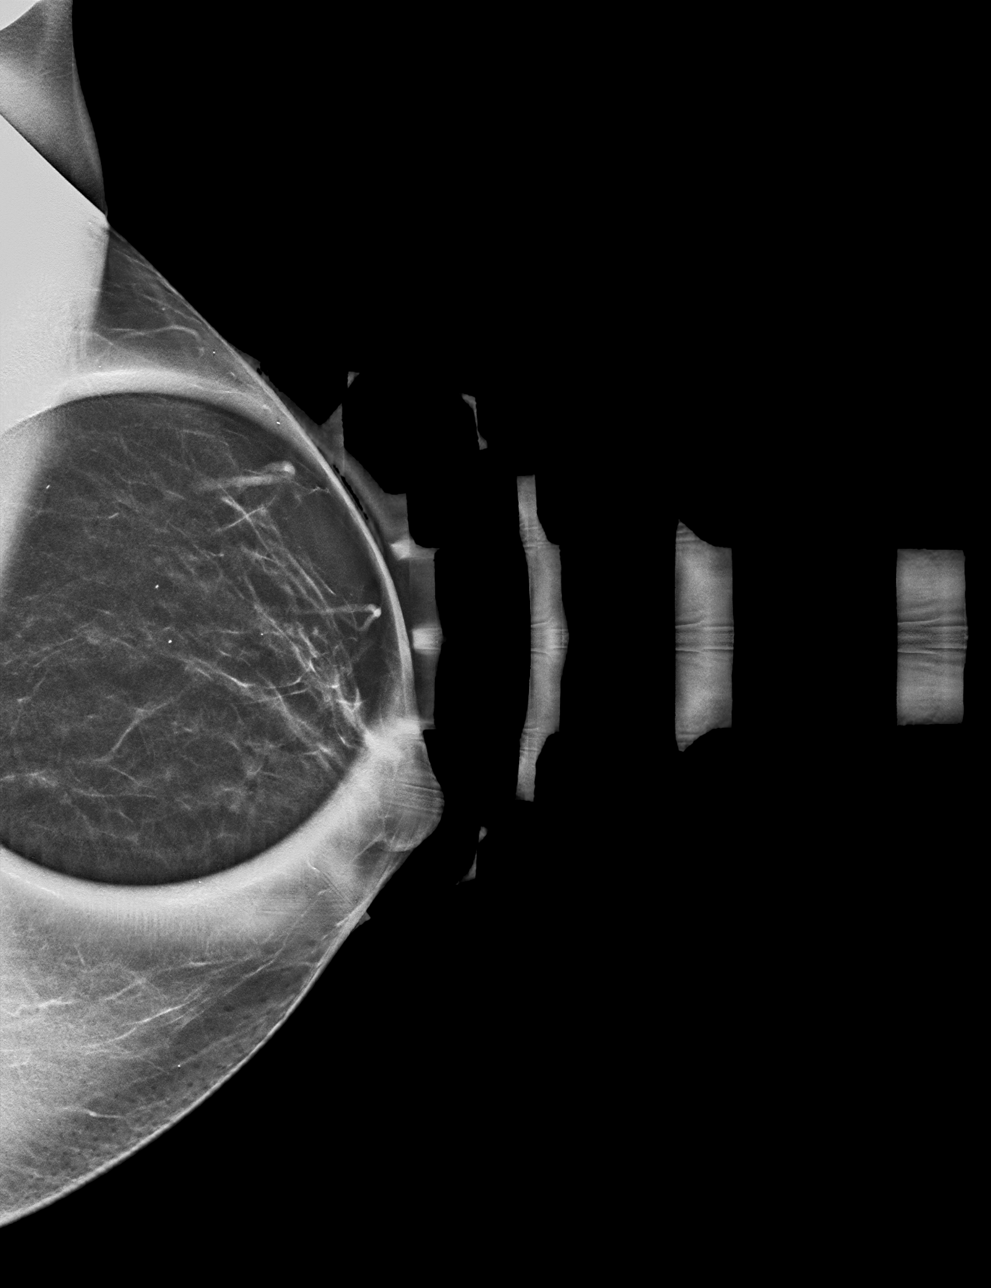

[L ML tomo · tomo slice 31/61.0]
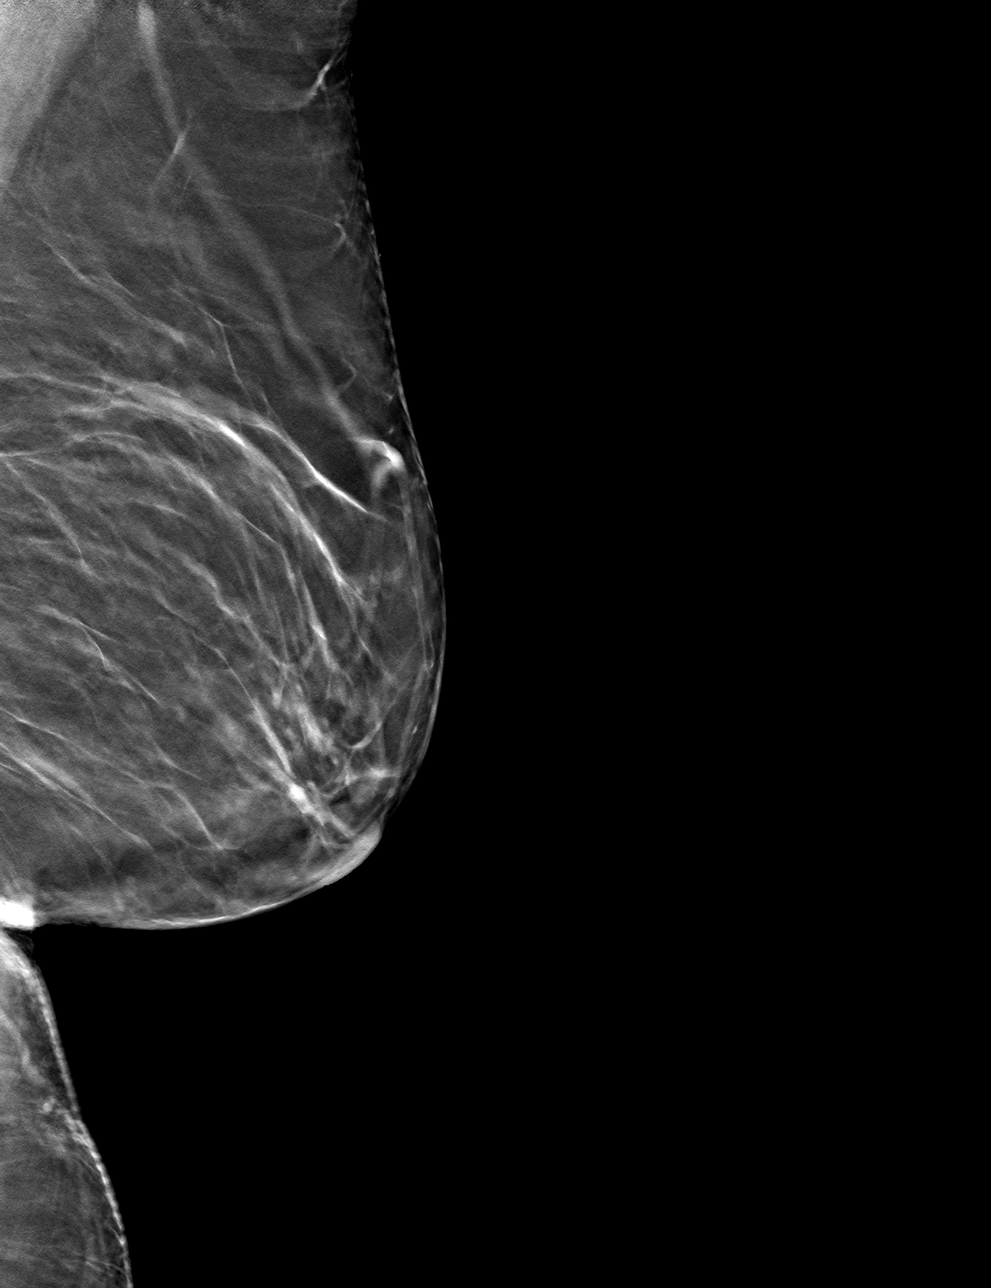

[L CC tomo · tomo slice 23/44.0]
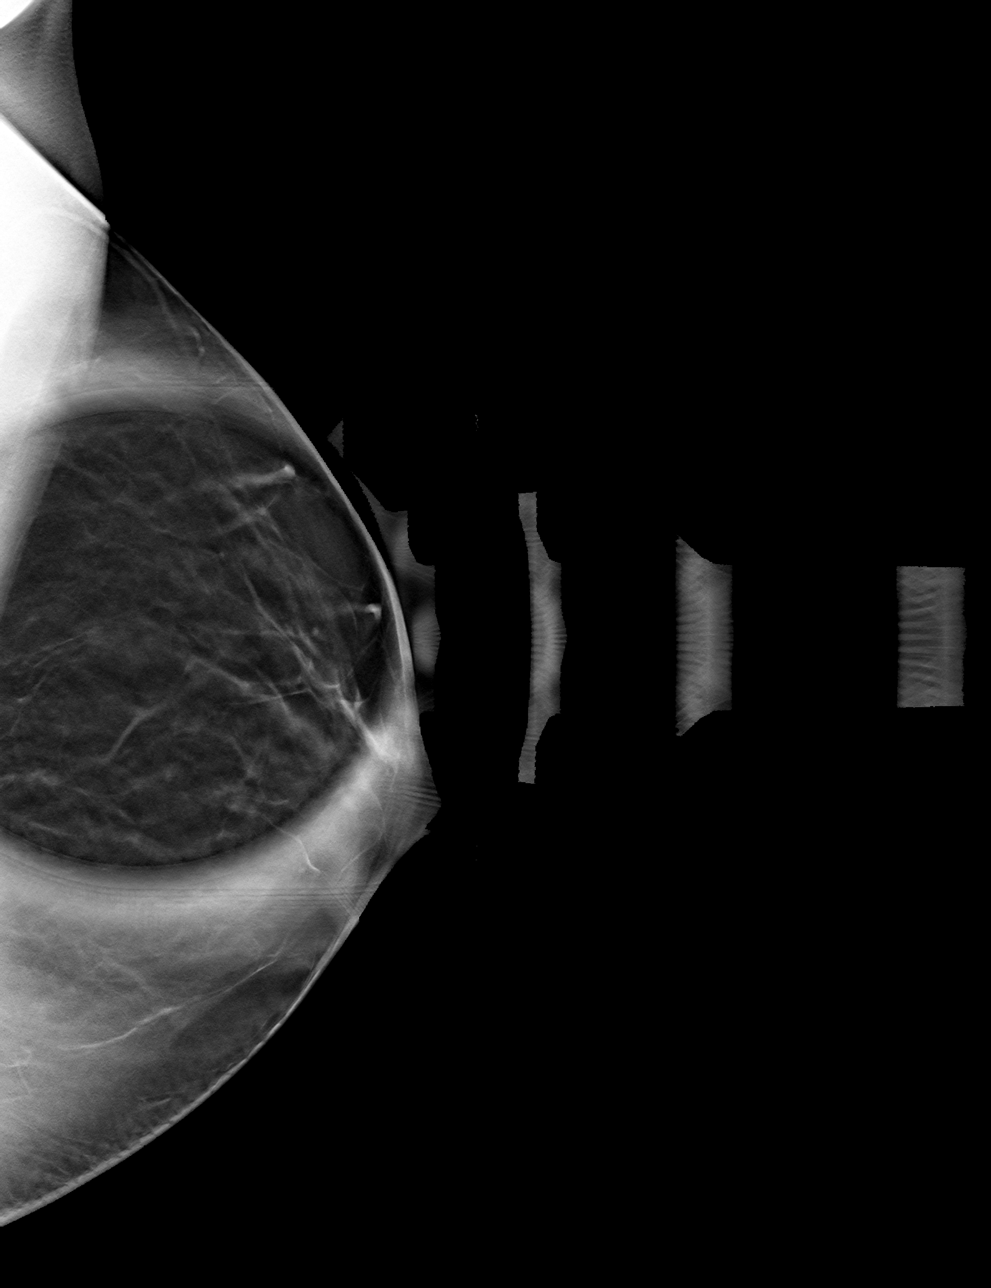

[4 of 12 positions shown; findings below may reference images not displayed]

ACR Breast Density Category b: There are scattered areas of
fibroglandular density.
FINDINGS: On today's additional diagnostic views, including spot compression
with 3D tomosynthesis, there is no persistent asymmetry within the
outer LEFT breast indicating superimposition of normal
fibroglandular tissues.
IMPRESSION: No evidence of malignancy.

Patient may return to routine annual bilateral screening mammogram
schedule.

RECOMMENDATION:
Screening mammogram in one year.(Code:[id number])

I have discussed the findings and recommendations with the patient.
If applicable, a reminder letter will be sent to the patient
regarding the next appointment.

BI-RADS CATEGORY  1: Negative.

## 2022-11-09 ENCOUNTER — Ambulatory Visit (INDEPENDENT_AMBULATORY_CARE_PROVIDER_SITE_OTHER): Payer: Medicaid Other | Admitting: Adult Health

## 2022-11-09 ENCOUNTER — Encounter (INDEPENDENT_AMBULATORY_CARE_PROVIDER_SITE_OTHER): Payer: Self-pay | Admitting: Adult Health

## 2022-11-09 VITALS — BP 133/69 | HR 95 | Temp 97.9°F | Ht 63.0 in | Wt 271.0 lb

## 2022-11-09 DIAGNOSIS — R748 Abnormal levels of other serum enzymes: Secondary | ICD-10-CM | POA: Diagnosis not present

## 2022-11-09 DIAGNOSIS — Z6841 Body Mass Index (BMI) 40.0 and over, adult: Secondary | ICD-10-CM

## 2022-11-09 DIAGNOSIS — E559 Vitamin D deficiency, unspecified: Secondary | ICD-10-CM | POA: Diagnosis not present

## 2022-11-09 DIAGNOSIS — E88819 Insulin resistance, unspecified: Secondary | ICD-10-CM | POA: Diagnosis not present

## 2022-11-09 MED ORDER — VITAMIN D (ERGOCALCIFEROL) 1.25 MG (50000 UNIT) PO CAPS: 50000.0000 [IU] | ORAL_CAPSULE | ORAL | 0 refills | Status: DC

## 2022-11-09 NOTE — Progress Notes (Signed)
WEIGHT SUMMARY AND BIOMETRICS  Vitals Temp: 97.9 F (36.6 C) BP: 133/69 Pulse Rate: 95 SpO2: 97 %   Anthropometric Measurements Height: 5\' 3"  (1.6 m) Weight: 271 lb (122.9 kg) BMI (Calculated): 48.02 Weight at Last Visit: 269lb Weight Lost Since Last Visit: 0 Weight Gained Since Last Visit: 2lb Starting Weight: 266lb Total Weight Loss (lbs): 0 lb (0 kg)   Body Composition  Body Fat %: 49.9 % Fat Mass (lbs): 135.6 lbs Muscle Mass (lbs): 129.2 lbs Total Body Water (lbs): 97.6 lbs Visceral Fat Rating : 18   Other Clinical Data Fasting: no Labs: no Today's Visit #: 5 Starting Date: 08/11/22    Chief Complaint:   OBESITY Ebony Ortiz is here to discuss her progress with her obesity treatment plan. She is on the the Category 4 Plan and states she is following her eating plan approximately 100 % of the time. She states she is exercising YouTube- HITT Exercise Video 55+ minutes 2-3 times per week.   Interim History:  [name] reports 100% compliance on prescribed Cat 4 meal plan  She has increased intensity of exercise and time, YouTube 2-3 x week  Hydration-she estimates to drink 100 oz water/day  Reviewed Bioimpedance results with pt: Muscle Mass: + 8.8  lbs Adipose Mass: - 6.8 lbs  Subjective:   1. Elevated liver enzymes  Latest Reference Range & Units 08/11/22 10:21  AST 0 - 40 IU/L 60 (H)  ALT 0 - 32 IU/L 38 (H)  (H): Data is abnormally high She denies RUQ pain  2. Vitamin D deficiency  Latest Reference Range & Units 08/11/22 10:21  Vitamin D, 25-Hydroxy 30.0 - 100.0 ng/mL 29.7 (L)  (L): Data is abnormally low  She is on weekly Ergocalciferol- denies N/V/Muscle Weakness  3. Insulin resistance  Latest Reference Range & Units 08/11/22 10:21  INSULIN 2.6 - 24.9 uIU/mL 27.7 (H)  (H): Data is abnormally high She denies polyphagia  Assessment/Plan:   1. Elevated liver enzymes Continue with weight loss efforts Avoid Hepatotoxic substances  2.  Vitamin D deficiency Refill  Vitamin D, Ergocalciferol, (DRISDOL) 1.25 MG (50000 UNIT) CAPS capsule Take 1 capsule (50,000 Units total) by mouth every 7 (seven) days. Dispense: 12 capsule, Refills: 0 ordered   3. Insulin resistance   4. Morbid obesity with body mass index (BMI) of 45.0 to 49.9 in adult [facility]), Current BMI 48.02  [name] is currently in the action stage of change. As such, her goal is to continue with weight loss efforts. She has agreed to the Category 4 Plan.   Exercise goals: For substantial health benefits, adults should do at least 150 minutes (2 hours and 30 minutes) a week of moderate-intensity, or 75 minutes (1 hour and 15 minutes) a week of vigorous-intensity aerobic physical activity, or an equivalent combination of moderate- and vigorous-intensity aerobic activity. Aerobic activity should be performed in episodes of at least 10 minutes, and preferably, it should be spread throughout the week.  Behavioral modification strategies: increasing lean protein intake, decreasing simple carbohydrates, increasing vegetables, increasing water intake, no skipping meals, meal planning and cooking strategies, and avoiding temptations.  Blima has agreed to follow-up with our clinic in 4 weeks. She was informed of the importance of frequent follow-up visits to maximize her success with intensive lifestyle modifications for her multiple health conditions.   Check Fasting Labs at next OV  Objective:   Blood pressure 133/69, pulse 95, temperature 97.9 F (36.6 C), height 5\' 3"  (1.6 m), weight  271 lb (122.9 kg), SpO2 97 %. Body mass index is 48.01 kg/m.  General: Cooperative, alert, well developed, in no acute distress. HEENT: Conjunctivae and lids unremarkable. Cardiovascular: Regular rhythm.  Lungs: Normal work of breathing. Neurologic: No focal deficits.   Lab Results  Component Value Date   CREATININE 1.12 (H) 08/11/2022   BUN 22 08/11/2022   NA 134 08/11/2022   K 4.4  08/11/2022   CL 97 08/11/2022   CO2 23 08/11/2022   Lab Results  Component Value Date   ALT 38 (H) 08/11/2022   AST 60 (H) 08/11/2022   ALKPHOS 49 08/11/2022   BILITOT 0.4 08/11/2022   Lab Results  Component Value Date   HGBA1C 5.6 08/11/2022   Lab Results  Component Value Date   INSULIN 27.7 (H) 08/11/2022   Lab Results  Component Value Date   TSH 24.000 (H) 08/11/2022   Lab Results  Component Value Date   CHOL 148 08/11/2022   HDL 26 (L) 08/11/2022   LDLCALC 90 08/11/2022   TRIG 185 (H) 08/11/2022   Lab Results  Component Value Date   VD25OH 29.7 (L) 08/11/2022   Lab Results  Component Value Date   WBC 4.7 08/11/2022   HGB 11.9 08/11/2022   HCT 34.2 08/11/2022   MCV 90 08/11/2022   PLT 199 08/11/2022   No results found for: "IRON", "TIBC", "FERRITIN"  Attestation Statements:   Reviewed by clinician on day of visit: allergies, medications, problem list, medical history, surgical history, family history, social history, and previous encounter notes.  I have reviewed the above documentation for accuracy and completeness, and I agree with the above. -  Teoman Giraud d. Lada Fulbright, NP-C

## 2022-12-13 ENCOUNTER — Telehealth (INDEPENDENT_AMBULATORY_CARE_PROVIDER_SITE_OTHER): Payer: Self-pay | Admitting: Adult Health

## 2022-12-13 ENCOUNTER — Ambulatory Visit (INDEPENDENT_AMBULATORY_CARE_PROVIDER_SITE_OTHER): Payer: Medicaid Other | Admitting: Adult Health

## 2022-12-13 ENCOUNTER — Encounter (INDEPENDENT_AMBULATORY_CARE_PROVIDER_SITE_OTHER): Payer: Self-pay | Admitting: Adult Health

## 2022-12-13 VITALS — BP 119/83 | HR 80 | Temp 98.2°F | Ht 63.0 in | Wt 270.0 lb

## 2022-12-13 DIAGNOSIS — Z6841 Body Mass Index (BMI) 40.0 and over, adult: Secondary | ICD-10-CM

## 2022-12-13 DIAGNOSIS — R748 Abnormal levels of other serum enzymes: Secondary | ICD-10-CM | POA: Diagnosis not present

## 2022-12-13 DIAGNOSIS — E559 Vitamin D deficiency, unspecified: Secondary | ICD-10-CM | POA: Diagnosis not present

## 2022-12-13 DIAGNOSIS — Z0000 Encounter for general adult medical examination without abnormal findings: Secondary | ICD-10-CM

## 2022-12-13 DIAGNOSIS — E88819 Insulin resistance, unspecified: Secondary | ICD-10-CM | POA: Diagnosis not present

## 2022-12-13 MED ORDER — WEGOVY 0.25 MG/0.5ML ~~LOC~~ SOAJ: 0.2500 mg | SUBCUTANEOUS | 0 refills | Status: DC

## 2022-12-13 MED ORDER — VITAMIN D (ERGOCALCIFEROL) 1.25 MG (50000 UNIT) PO CAPS: 50000.0000 [IU] | ORAL_CAPSULE | ORAL | 0 refills | Status: DC

## 2022-12-13 NOTE — Telephone Encounter (Signed)
PA submitted for [facility], waiting on a determination. 8:46 12/13/22 KP

## 2022-12-13 NOTE — Progress Notes (Signed)
WEIGHT SUMMARY AND BIOMETRICS  Vitals Temp: 98.2 F (36.8 C) BP: 119/83 Pulse Rate: 80 SpO2: 94 %   Anthropometric Measurements Height: 5\' 3"  (1.6 m) Weight: 270 lb (122.5 kg) BMI (Calculated): 47.84 Weight at Last Visit: 271lb Weight Lost Since Last Visit: 1lb Weight Gained Since Last Visit: 0 Starting Weight: 266lb Total Weight Loss (lbs): 0 lb (0 kg)   Body Composition  Body Fat %: 52.6 % Fat Mass (lbs): 142.4 lbs Muscle Mass (lbs): 121.8 lbs Total Body Water (lbs): 94.6 lbs Visceral Fat Rating : 19   Other Clinical Data Fasting: yes Labs: yes Today's Visit #: 6 Starting Date: 08/11/22    Chief Complaint:   OBESITY Ebony Ortiz is here to discuss her progress with her obesity treatment plan. She is on the the Category 4 Plan and states she is following her eating plan approximately 100 % of the time. She states she is exercising Zumba, Walking, Running, YouTube Videos60 minutes 3 times per week.   Interim History:  [name] reports increased fatigue due to caring for her large injured [place] dog, re: front, left paw- unable to walk or stand normally  Hunger/appetite-[name] reports 100% compliance with prescribed Cat 4 meal plan  Stress- works continues to be persistent source of stress  Exercise-she continues to be quite active- brisk walking, Zumba, running, and YouTube workouts   Subjective:   1. Health care maintenance Endocrinology has assumed management of Hypothyroidism- daily Levothyroxine 150 mcg QD  2. Elevated liver enzymes She denies RUQ pain She has not had abdominal US  3. Vitamin D deficiency  Latest Reference Range & Units 08/11/22 10:21  Vitamin D, 25-Hydroxy 30.0 - 100.0 ng/mL 29.7 (L)  (L): Data is abnormally low She is on weekly Ergocalciferol- denies N/V/Muscle Weakness  4. Insulin resistance  Latest Reference Range & Units 08/11/22 10:21  Glucose 70 - 99 mg/dL 91  Hemoglobin B2W 4.8 - 5.6 % 5.6  Est. average  glucose Bld gHb Est-mCnc mg/dL 413  INSULIN 2.6 - 24.4 uIU/mL 27.7 (H)  (H): Data is abnormally high  She denies family hx of MEN 2 or MTC She denies personal hx of pancreatitis Discussed risks/benefits of GLP-1 therapy, re: WNUUVO  Assessment/Plan:   1. Health care maintenance F/u with Endo as directed  2. Elevated liver enzymes Check Labs - Comprehensive metabolic panel Continue with weight loss efforts  3. Vitamin D deficiency Check Labs - VITAMIN D 25 Hydroxy (Vit-D Deficiency, Fractures)  4. Insulin resistance Check Labs - Hemoglobin A1c - Insulin, random  5. Morbid obesity with body mass index (BMI) of 45.0 to 49.9 in adult [facility] Current BMI 48.02 Start  Semaglutide-Weight Management (WEGOVY) 0.25 MG/0.5ML SOAJ Inject 0.25 mg into the skin once a week. Dispense: 2 mL, Refills: 0 ordered   [name] is currently in the action stage of change. As such, her goal is to continue with weight loss efforts. She has agreed to the Category 4 Plan.   Exercise goals: For substantial health benefits, adults should do at least 150 minutes (2 hours and 30 minutes) a week of moderate-intensity, or 75 minutes (1 hour and 15 minutes) a week of vigorous-intensity aerobic physical activity, or an equivalent combination of moderate- and vigorous-intensity aerobic activity. Aerobic activity should be performed in episodes of at least 10 minutes, and preferably, it should be spread throughout the week.  Behavioral modification strategies: increasing lean protein intake, decreasing simple carbohydrates, increasing vegetables, increasing water intake, decreasing eating out,  no skipping meals, meal planning and cooking strategies, keeping healthy foods in the home, and planning for success.  [name] has agreed to follow-up with our clinic in 3 weeks. She was informed of the importance of frequent follow-up visits to maximize her success with intensive lifestyle modifications for her multiple health  conditions.   [name] was informed we would discuss her lab results at her next visit unless there is a critical issue that needs to be addressed sooner. [name] agreed to keep her next visit at the agreed upon time to discuss these results.  Objective:   Blood pressure 119/83, pulse 80, temperature 98.2 F (36.8 C), height 5\' 3"  (1.6 m), weight 270 lb (122.5 kg), SpO2 94%. Body mass index is 47.83 kg/m.  General: Cooperative, alert, well developed, in no acute distress. HEENT: Conjunctivae and lids unremarkable. Cardiovascular: Regular rhythm.  Lungs: Normal work of breathing. Neurologic: No focal deficits.   Lab Results  Component Value Date   CREATININE 1.12 (H) 08/11/2022   BUN 22 08/11/2022   NA 134 08/11/2022   K 4.4 08/11/2022   CL 97 08/11/2022   CO2 23 08/11/2022   Lab Results  Component Value Date   ALT 38 (H) 08/11/2022   AST 60 (H) 08/11/2022   ALKPHOS 49 08/11/2022   BILITOT 0.4 08/11/2022   Lab Results  Component Value Date   HGBA1C 5.6 08/11/2022   Lab Results  Component Value Date   INSULIN 27.7 (H) 08/11/2022   Lab Results  Component Value Date   TSH 24.000 (H) 08/11/2022   Lab Results  Component Value Date   CHOL 148 08/11/2022   HDL 26 (L) 08/11/2022   LDLCALC 90 08/11/2022   TRIG 185 (H) 08/11/2022   Lab Results  Component Value Date   VD25OH 29.7 (L) 08/11/2022   Lab Results  Component Value Date   WBC 4.7 08/11/2022   HGB 11.9 08/11/2022   HCT 34.2 08/11/2022   MCV 90 08/11/2022   PLT 199 08/11/2022   No results found for: "IRON", "TIBC", "FERRITIN"  Attestation Statements:   Reviewed by clinician on day of visit: allergies, medications, problem list, medical history, surgical history, family history, social history, and previous encounter notes.  I have reviewed the above documentation for accuracy and completeness, and I agree with the above. -  Rudell Ortman d. Reef Achterberg, NP-C

## 2022-12-14 NOTE — Telephone Encounter (Signed)
PA for Reginal Lutes has been approved until End Date 06/11/2023. 7:31 am 12/14/2022 KP

## 2023-01-04 ENCOUNTER — Other Ambulatory Visit (INDEPENDENT_AMBULATORY_CARE_PROVIDER_SITE_OTHER): Payer: Self-pay | Admitting: Adult Health

## 2023-01-09 ENCOUNTER — Other Ambulatory Visit (INDEPENDENT_AMBULATORY_CARE_PROVIDER_SITE_OTHER): Payer: Self-pay | Admitting: Adult Health

## 2023-01-12 ENCOUNTER — Telehealth (INDEPENDENT_AMBULATORY_CARE_PROVIDER_SITE_OTHER): Payer: Self-pay | Admitting: Adult Health

## 2023-01-12 NOTE — Telephone Encounter (Signed)
Patient called stating that she needs a refill of Wegovy. Pt states that her pharmacy tried to call us three times during the week, but could not reach [place]. Pt states the pharmacist states that she needs to move up from the dosage of 0.25 to the next increment. Please call patient at the number on file.

## 2023-01-16 ENCOUNTER — Encounter (INDEPENDENT_AMBULATORY_CARE_PROVIDER_SITE_OTHER): Payer: Self-pay

## 2023-01-17 ENCOUNTER — Telehealth (INDEPENDENT_AMBULATORY_CARE_PROVIDER_SITE_OTHER): Payer: Self-pay

## 2023-01-17 ENCOUNTER — Telehealth: Payer: Self-pay | Admitting: *Deleted

## 2023-01-17 ENCOUNTER — Encounter (INDEPENDENT_AMBULATORY_CARE_PROVIDER_SITE_OTHER): Payer: Self-pay | Admitting: Adult Health

## 2023-01-17 ENCOUNTER — Ambulatory Visit (INDEPENDENT_AMBULATORY_CARE_PROVIDER_SITE_OTHER): Payer: Medicaid Other | Admitting: Adult Health

## 2023-01-17 VITALS — BP 120/79 | HR 76 | Temp 98.3°F | Ht 63.0 in | Wt 271.0 lb

## 2023-01-17 DIAGNOSIS — E559 Vitamin D deficiency, unspecified: Secondary | ICD-10-CM | POA: Diagnosis not present

## 2023-01-17 DIAGNOSIS — E88819 Insulin resistance, unspecified: Secondary | ICD-10-CM

## 2023-01-17 DIAGNOSIS — Z6841 Body Mass Index (BMI) 40.0 and over, adult: Secondary | ICD-10-CM

## 2023-01-17 DIAGNOSIS — R748 Abnormal levels of other serum enzymes: Secondary | ICD-10-CM

## 2023-01-17 MED ORDER — VITAMIN D (ERGOCALCIFEROL) 1.25 MG (50000 UNIT) PO CAPS: 50000.0000 [IU] | ORAL_CAPSULE | ORAL | 0 refills | Status: DC

## 2023-01-17 MED ORDER — WEGOVY 0.5 MG/0.5ML ~~LOC~~ SOAJ
0.5000 mg | SUBCUTANEOUS | 0 refills | Status: DC
Start: 2023-01-17 — End: 2023-02-14

## 2023-01-17 NOTE — Telephone Encounter (Signed)
PA for Reginal Lutes has been approved. 10:24 01/17/23 KP  Message from plan: Approved. This drug has been approved. Approved quantity: 2 units per 28 day(s). You may fill up to a 34 day supply at a retail pharmacy. You may fill up to a 90 day supply for maintenance drugs, please refer to the formulary for details. Please call the pharmacy to process your prescription claim.. Authorization Expiration Date: January 17, 2024.

## 2023-01-17 NOTE — Telephone Encounter (Signed)
Prior authorization done via cover my meds. Waiting on determination.

## 2023-01-17 NOTE — Telephone Encounter (Signed)
PA submitted for [facility], waiting on a determination. 9:37 01/17/23 KP

## 2023-01-17 NOTE — Progress Notes (Signed)
WEIGHT SUMMARY AND BIOMETRICS  Vitals Temp: 98.3 F (36.8 C) BP: 120/79 Pulse Rate: 76 SpO2: 97 %   Anthropometric Measurements Height: 5\' 3"  (1.6 m) Weight: 271 lb (122.9 kg) BMI (Calculated): 48.02 Weight at Last Visit: 270lb Weight Lost Since Last Visit: 0 Weight Gained Since Last Visit: 1lb Starting Weight: 266lb Total Weight Loss (lbs): 0 lb (0 kg)   Body Composition  Body Fat %: 52.8 % Fat Mass (lbs): 143 lbs Muscle Mass (lbs): 121.6 lbs Total Body Water (lbs): 97.8 lbs Visceral Fat Rating : 19   Other Clinical Data Fasting: no Labs: no Today's Visit #: 7 Starting Date: 08/11/22    Chief Complaint:   OBESITY Ebony Ortiz is here to discuss her progress with her obesity treatment plan. She is on the the Category 4 Plan and states she is following her eating plan approximately 90 % of the time. She states she is exercising Walking 60 minutes 3 times per week.   Interim History:  She returned home from an emergency visit to [place]. Her best friends husband attempted suicide. He is stabilized in ICU and will be transferred to [facility] unit this week   Hunger/appetite-she reports stable appetite  Exercise-brisk walking at least 3 times per week  Hydration-she estimates to drink >100 oz water/day  Started on Wegovy 0.25mg  on/about 12/13/2022 She has had 4 doses Denies mass in neck, dysphagia, dyspepsia, persistent hoarseness, abdominal pain, or N/V/C   Subjective:   1. Elevated liver enzymes Discussed Labs  Latest Reference Range & Units 08/11/22 10:21 12/13/22 08:06  AST 0 - 40 IU/L 60 (H) 52 (H)  ALT 0 - 32 IU/L 38 (H) 40 (H)  (H): Data is abnormally high  Levels slightly improve, however still above goal She denies RUQ pain. She denies N/V  2. Insulin resistance Discussed Labs  Latest Reference Range & Units 08/11/22 10:21 12/13/22 08:06  INSULIN 2.6 - 24.9 uIU/mL 27.7 (H) 52.7 (H)  (H): Data is abnormally high   Latest  Reference Range & Units 12/13/22 08:06  Glucose 70 - 99 mg/dL 295 (H)  Hemoglobin J8A 4.8 - 5.6 % 5.6  Est. average glucose Bld gHb Est-mCnc mg/dL 416  (H): Data is abnormally high  BG, Insulin levels both worsened. A1c high normal.  Started on Wegovy 0.25mg  on/about 12/13/2022 She has had 4 doses Denies mass in neck, dysphagia, dyspepsia, persistent hoarseness, abdominal pain, or N/V/C   3. Vit D Def Discussed Labs  Latest Reference Range & Units 08/11/22 10:21 12/13/22 08:06  Vitamin D, 25-Hydroxy 30.0 - 100.0 ng/mL 29.7 (L) 46.3  (L): Data is abnormally low  She is in weekly Ergocalciferol- denies N/V/Muscle Weakness Vit D level increased, still below goal 50-70  Assessment/Plan:   1. Elevated liver enzymes Continue with weight loss efforts  2. Insulin resistance Continue with weight loss efforts Continue GLP-1 therapy  3. Vit D Def Refill  Vitamin D, Ergocalciferol, (DRISDOL) 1.25 MG (50000 UNIT) CAPS capsule Take 1 capsule (50,000 Units total) by mouth every 7 (seven) days. Dispense: 12 capsule, Refills: 0 ordered   4. Morbid obesity with body mass index (BMI) of 45.0 to 49.9 in adult [facility]), Current BMI 48.02 Refill and increase []   Semaglutide-Weight Management (WEGOVY) 0.5 MG/0.5ML SOAJ Inject 0.5 mg into the skin once a week. Dispense: 2 mL, Refills: 0 ordered   Ebony Ortiz is currently in the action stage of change. As such, her goal is to continue with weight loss efforts. She has  agreed to the Category 4 Plan.   Exercise goals: For substantial health benefits, adults should do at least 150 minutes (2 hours and 30 minutes) a week of moderate-intensity, or 75 minutes (1 hour and 15 minutes) a week of vigorous-intensity aerobic physical activity, or an equivalent combination of moderate- and vigorous-intensity aerobic activity. Aerobic activity should be performed in episodes of at least 10 minutes, and preferably, it should be spread throughout the week.  Behavioral  modification strategies: increasing lean protein intake, decreasing simple carbohydrates, increasing vegetables, increasing water intake, decreasing eating out, no skipping meals, meal planning and cooking strategies, keeping healthy foods in the home, emotional eating strategies, and planning for success.  Ebony Ortiz has agreed to follow-up with our clinic in 4 weeks. She was informed of the importance of frequent follow-up visits to maximize her success with intensive lifestyle modifications for her multiple health conditions.   Objective:   Blood pressure 120/79, pulse 76, temperature 98.3 F (36.8 C), height 5\' 3"  (1.6 m), weight 271 lb (122.9 kg), SpO2 97%. Body mass index is 48.01 kg/m.  General: Cooperative, alert, well developed, in no acute distress. HEENT: Conjunctivae and lids unremarkable. Cardiovascular: Regular rhythm.  Lungs: Normal work of breathing. Neurologic: No focal deficits.   Lab Results  Component Value Date   CREATININE 1.03 (H) 12/13/2022   BUN 18 12/13/2022   NA 139 12/13/2022   K 4.4 12/13/2022   CL 102 12/13/2022   CO2 23 12/13/2022   Lab Results  Component Value Date   ALT 40 (H) 12/13/2022   AST 52 (H) 12/13/2022   ALKPHOS 64 12/13/2022   BILITOT 0.4 12/13/2022   Lab Results  Component Value Date   HGBA1C 5.6 12/13/2022   HGBA1C 5.6 08/11/2022   Lab Results  Component Value Date   INSULIN 52.7 (H) 12/13/2022   INSULIN 27.7 (H) 08/11/2022   Lab Results  Component Value Date   TSH 24.000 (H) 08/11/2022   Lab Results  Component Value Date   CHOL 148 08/11/2022   HDL 26 (L) 08/11/2022   LDLCALC 90 08/11/2022   TRIG 185 (H) 08/11/2022   Lab Results  Component Value Date   VD25OH 46.3 12/13/2022   VD25OH 29.7 (L) 08/11/2022   Lab Results  Component Value Date   WBC 4.7 08/11/2022   HGB 11.9 08/11/2022   HCT 34.2 08/11/2022   MCV 90 08/11/2022   PLT 199 08/11/2022   No results found for: "IRON", "TIBC", "FERRITIN"  Attestation  Statements:   Reviewed by clinician on day of visit: allergies, medications, problem list, medical history, surgical history, family history, social history, and previous encounter notes.  I have reviewed the above documentation for accuracy and completeness, and I agree with the above. -  Krystal Delduca d. Garner Dullea, NP-C

## 2023-01-26 NOTE — Telephone Encounter (Signed)
Patients Reginal Lutes was approved.  Approved. This drug has been approved. Approved quantity: 2 units per 28 day(s). You may fill up to a 34 day supply at a retail pharmacy. You may fill up to a 90 day supply for maintenance drugs, please refer to the formulary for details. Please call the pharmacy to process your prescription claim.. Authorization Expiration Date: January 17, 2024.

## 2023-02-14 ENCOUNTER — Encounter (INDEPENDENT_AMBULATORY_CARE_PROVIDER_SITE_OTHER): Payer: Self-pay | Admitting: Adult Health

## 2023-02-14 ENCOUNTER — Ambulatory Visit (INDEPENDENT_AMBULATORY_CARE_PROVIDER_SITE_OTHER): Payer: Medicaid Other | Admitting: Adult Health

## 2023-02-14 VITALS — BP 111/71 | HR 86 | Temp 98.1°F | Ht 63.0 in | Wt 266.0 lb

## 2023-02-14 DIAGNOSIS — E7800 Pure hypercholesterolemia, unspecified: Secondary | ICD-10-CM | POA: Diagnosis not present

## 2023-02-14 DIAGNOSIS — E88819 Insulin resistance, unspecified: Secondary | ICD-10-CM

## 2023-02-14 DIAGNOSIS — Z6841 Body Mass Index (BMI) 40.0 and over, adult: Secondary | ICD-10-CM | POA: Diagnosis not present

## 2023-02-14 MED ORDER — SEMAGLUTIDE-WEIGHT MANAGEMENT 1 MG/0.5ML ~~LOC~~ SOAJ: 1.0000 mg | SUBCUTANEOUS | 0 refills | Status: DC

## 2023-02-14 NOTE — Progress Notes (Addendum)
WEIGHT SUMMARY AND BIOMETRICS  Vitals Temp: 98.1 F (36.7 C) BP: 111/71 Pulse Rate: 86 SpO2: 98 %   Anthropometric Measurements Height: 5\' 3"  (1.6 m) Weight: 266 lb (120.7 kg) BMI (Calculated): 47.13 Weight at Last Visit: 271lb Weight Lost Since Last Visit: 5lb Weight Gained Since Last Visit: 0 Starting Weight: 266lb Total Weight Loss (lbs): 0 lb (0 kg)   Body Composition  Body Fat %: 51 % Fat Mass (lbs): 135.8 lbs Muscle Mass (lbs): 124 lbs Total Body Water (lbs): 92.4 lbs Visceral Fat Rating : 18   Other Clinical Data Fasting: no Labs: no Today's Visit #: 8 Starting Date: 08/11/22    Chief Complaint:   OBESITY Ebony Ortiz is here to discuss her progress with her obesity treatment plan. She is on the the Category 4 Plan and states she is following her eating plan approximately 100 % of the time. She states she is exercising Brisk Walking 60 minutes 4 times per week.   Interim History:  12/13/2022 started on loading dose Wegovy 0.25mg  01/17/2023 increased Wegovy from 0.25mg  to 0.5mg  once weekly injection Denies mass in neck, dysphagia, dyspepsia, persistent hoarseness, abdominal pain, or N/V/C   Reviewed Bioimpedance results with pt: Muscle Mass: +2.4 lbs Adipose Mass: -7.2 lbs  Current weight 266 lbs with corresponding BMI 47.2 Goal weight 165 lbs with corresponding BMI 29.2  Subjective:   1. Insulin resistance  Latest Reference Range & Units 08/11/22 10:21 12/13/22 08:06  INSULIN 2.6 - 24.9 uIU/mL 27.7 (H) 52.7 (H)  (H): Data is abnormally high  Latest Reference Range & Units 12/13/22 08:06  Glucose 70 - 99 mg/dL 161 (H)  Hemoglobin W9U 4.8 - 5.6 % 5.6  Est. average glucose Bld gHb Est-mCnc mg/dL 045  INSULIN 2.6 - 40.9 uIU/mL 52.7 (H)  (H): Data is abnormally high  12/13/2022 started on loading dose Wegovy 0.25mg  01/17/2023 increased Wegovy from 0.25mg  to 0.5mg  once weekly injection Denies mass in neck, dysphagia, dyspepsia, persistent hoarseness,  abdominal pain, or N/V/C   2. Elevated Cholesterol Lipid Panel     Component Value Date/Time   CHOL 148 08/11/2022 1021   TRIG 185 (H) 08/11/2022 1021   HDL 26 (L) 08/11/2022 1021   LDLCALC 90 08/11/2022 1021   LABVLDL 32 08/11/2022 1021   The 10-year ASCVD risk score (Arnett DK, et al., 2019) is: 1.9%   Values used to calculate the score:     Age: 8 years     Sex: Female     Is Non-Hispanic African American: No     Diabetic: No     Tobacco smoker: No     Systolic Blood Pressure: 111 mmHg     Is BP treated: No     HDL Cholesterol: 26 mg/dL     Total Cholesterol: 148 mg/dL   Currently on weekly Wegovy 0.5mg  injection Denies mass in neck, dysphagia, dyspepsia, persistent hoarseness, abdominal pain, or N/V/C   Assessment/Plan:   1. Insulin resistance Continue Cat 4 meal plan, regular exercise, and weekly GLP-1 therapy  2. Elevated Cholesterol Limit saturated fat intake and regular exercise  3. Morbid obesity with body mass index (BMI) of 45.0 to 49.9 in adult [facility]), Current BMI 47.13 Refill and increase  Semaglutide-Weight Management 1 MG/0.5ML SOAJ Inject 1 mg into the skin once a week. Dispense: 2 mL, Refills: 0 ordered   [name] is currently in the action stage of change. As such, her goal is to continue with weight loss efforts. She has agreed  to the Category 4 Plan.   Exercise goals: For substantial health benefits, adults should do at least 150 minutes (2 hours and 30 minutes) a week of moderate-intensity, or 75 minutes (1 hour and 15 minutes) a week of vigorous-intensity aerobic physical activity, or an equivalent combination of moderate- and vigorous-intensity aerobic activity. Aerobic activity should be performed in episodes of at least 10 minutes, and preferably, it should be spread throughout the week.  Behavioral modification strategies: increasing lean protein intake, decreasing simple carbohydrates, increasing vegetables, increasing water intake, decreasing liquid  calories, no skipping meals, meal planning and cooking strategies, keeping healthy foods in the home, ways to avoid boredom eating, ways to avoid night time snacking, and planning for success.  [name] has agreed to follow-up with our clinic in 4 weeks. She was informed of the importance of frequent follow-up visits to maximize her success with intensive lifestyle modifications for her multiple health conditions.   Objective:   Blood pressure 111/71, pulse 86, temperature 98.1 F (36.7 C), height 5\' 3"  (1.6 m), weight 266 lb (120.7 kg), SpO2 98%. Body mass index is 47.12 kg/m.  General: Cooperative, alert, well developed, in no acute distress. HEENT: Conjunctivae and lids unremarkable. Cardiovascular: Regular rhythm.  Lungs: Normal work of breathing. Neurologic: No focal deficits.   Lab Results  Component Value Date   CREATININE 1.03 (H) 12/13/2022   BUN 18 12/13/2022   NA 139 12/13/2022   K 4.4 12/13/2022   CL 102 12/13/2022   CO2 23 12/13/2022   Lab Results  Component Value Date   ALT 40 (H) 12/13/2022   AST 52 (H) 12/13/2022   ALKPHOS 64 12/13/2022   BILITOT 0.4 12/13/2022   Lab Results  Component Value Date   HGBA1C 5.6 12/13/2022   HGBA1C 5.6 08/11/2022   Lab Results  Component Value Date   INSULIN 52.7 (H) 12/13/2022   INSULIN 27.7 (H) 08/11/2022   Lab Results  Component Value Date   TSH 24.000 (H) 08/11/2022   Lab Results  Component Value Date   CHOL 148 08/11/2022   HDL 26 (L) 08/11/2022   LDLCALC 90 08/11/2022   TRIG 185 (H) 08/11/2022   Lab Results  Component Value Date   VD25OH 46.3 12/13/2022   VD25OH 29.7 (L) 08/11/2022   Lab Results  Component Value Date   WBC 4.7 08/11/2022   HGB 11.9 08/11/2022   HCT 34.2 08/11/2022   MCV 90 08/11/2022   PLT 199 08/11/2022   No results found for: "IRON", "TIBC", "FERRITIN"  Attestation Statements:   Reviewed by clinician on day of visit: allergies, medications, problem list, medical history, surgical  history, family history, social history, and previous encounter notes.  I have reviewed the above documentation for accuracy and completeness, and I agree with the above. -  Saige Canton d. Dustin Burrill, NP-C

## 2023-03-14 ENCOUNTER — Encounter (INDEPENDENT_AMBULATORY_CARE_PROVIDER_SITE_OTHER): Payer: Self-pay | Admitting: Adult Health

## 2023-03-14 ENCOUNTER — Ambulatory Visit (INDEPENDENT_AMBULATORY_CARE_PROVIDER_SITE_OTHER): Payer: Medicaid Other | Admitting: Adult Health

## 2023-03-14 VITALS — BP 112/77 | HR 90 | Temp 98.2°F | Ht 63.0 in | Wt 260.0 lb

## 2023-03-14 DIAGNOSIS — R748 Abnormal levels of other serum enzymes: Secondary | ICD-10-CM

## 2023-03-14 DIAGNOSIS — E88819 Insulin resistance, unspecified: Secondary | ICD-10-CM

## 2023-03-14 DIAGNOSIS — Z6841 Body Mass Index (BMI) 40.0 and over, adult: Secondary | ICD-10-CM

## 2023-03-14 DIAGNOSIS — E559 Vitamin D deficiency, unspecified: Secondary | ICD-10-CM

## 2023-03-14 MED ORDER — VITAMIN D (ERGOCALCIFEROL) 1.25 MG (50000 UNIT) PO CAPS: 50000.0000 [IU] | ORAL_CAPSULE | ORAL | 0 refills | Status: DC

## 2023-03-14 MED ORDER — SEMAGLUTIDE-WEIGHT MANAGEMENT 1 MG/0.5ML ~~LOC~~ SOAJ: 1.0000 mg | SUBCUTANEOUS | 0 refills | Status: DC

## 2023-03-14 NOTE — Progress Notes (Signed)
WEIGHT SUMMARY AND BIOMETRICS  Vitals Temp: 98.2 F (36.8 C) BP: 112/77 Pulse Rate: 90 SpO2: 98 %   Anthropometric Measurements Height: 5\' 3"  (1.6 m) Weight: 260 lb (117.9 kg) BMI (Calculated): 46.07 Weight at Last Visit: 266lb Weight Lost Since Last Visit: 6lb Weight Gained Since Last Visit: 0 Starting Weight: 266lb Total Weight Loss (lbs): 6 lb (2.722 kg)   Body Composition  Body Fat %: 51.5 % Fat Mass (lbs): 134 lbs Muscle Mass (lbs): 119.6 lbs Total Body Water (lbs): 93 lbs Visceral Fat Rating : 18   Other Clinical Data Fasting: no Labs: no Today's Visit #: 9 Starting Date: 08/11/22    Chief Complaint:   OBESITY Ebony Ortiz is here to discuss her progress with her obesity treatment plan. She is on the the Category 4 Plan and states she is following her eating plan approximately 100 % of the time.  She states she is exercising Walking 3 miles/day minutes 5 times per week.   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 Insulin resistance 12/13/2022 started on loading dose Wegovy 0.25mg  once weekly injection 01/17/2023 increased Wegovy from 0.25mg  to 0.5mg  once weekly injection 02/14/2023 increased Wegovy from 0.5mg  to 1mg  once weekly injection Denies mass in neck, dysphagia, dyspepsia, persistent hoarseness, abdominal pain, or N/V/C  She denies any dramatic change in hunger levels with GLP-1 increase  2. Elevated liver enzymes She denies RUQ pain She denies GI upset  3. Vit D Def  Latest Reference Range & Units 08/11/22 10:21 12/13/22 08:06  Vitamin D,  25-Hydroxy 30.0 - 100.0 ng/mL 29.7 (L) 46.3  (L): Data is abnormally low  Level improving, just below goal of 50-70 She is on weekly Ergocalciferol- denies N/V/Muscle Weakness  Assessment/Plan:   1. Insulin resistance Continue excellent protein intake  2. Elevated liver enzymes Avoid Hepatotoxic substances Monitor labs  3. Vit D Def Refill  Vitamin D, Ergocalciferol, (DRISDOL) 1.25 MG (50000 UNIT) CAPS capsule Take 1 capsule (50,000 Units total) by mouth every 7 (seven) days. Dispense: 12 capsule, Refills: 0 ordered   3. Morbid obesity with body mass index (BMI) of 45.0 to 49.9 in adult Ebony Ortiz), Current BMI 46.07 Refill  Semaglutide-Weight Management 1 MG/0.5ML SOAJ Inject 1 mg into the skin once a week. Dispense: 2 mL, Refills: 0 ordered   Ebony Ortiz is currently in the action stage of change. As such, her goal is to continue with weight loss efforts. She has agreed to the Category 4 Plan.   Exercise goals: For substantial health benefits, adults should do at least 150 minutes (2 hours and 30 minutes) a week of moderate-intensity, or 75 minutes (1 hour and 15 minutes) a week of vigorous-intensity aerobic physical activity, or an equivalent combination of moderate- and vigorous-intensity aerobic activity. Aerobic activity should be performed in episodes of at least 10 minutes, and preferably, it should be spread throughout the week.  Behavioral modification strategies: increasing lean protein intake, decreasing simple carbohydrates, increasing vegetables, increasing water intake, no skipping meals, meal planning and cooking strategies, keeping healthy foods in the home, and planning for success.  Ebony Ortiz has agreed to  follow-up with our clinic in 4 weeks. She was informed of the importance of frequent follow-up visits to maximize her success with intensive lifestyle modifications for her multiple health conditions.   Objective:   Blood pressure 112/77, pulse 90, temperature 98.2 F (36.8 C),  height 5\' 3"  (1.6 m), weight 260 lb (117.9 kg), SpO2 98%. Body mass index is 46.06 kg/m.  General: Cooperative, alert, well developed, in no acute distress. HEENT: Conjunctivae and lids unremarkable. Cardiovascular: Regular rhythm.  Lungs: Normal work of breathing. Neurologic: No focal deficits.   Lab Results  Component Value Date   CREATININE 1.03 (H) 12/13/2022   BUN 18 12/13/2022   NA 139 12/13/2022   K 4.4 12/13/2022   CL 102 12/13/2022   CO2 23 12/13/2022   Lab Results  Component Value Date   ALT 40 (H) 12/13/2022   AST 52 (H) 12/13/2022   ALKPHOS 64 12/13/2022   BILITOT 0.4 12/13/2022   Lab Results  Component Value Date   HGBA1C 5.6 12/13/2022   HGBA1C 5.6 08/11/2022   Lab Results  Component Value Date   INSULIN 52.7 (H) 12/13/2022   INSULIN 27.7 (H) 08/11/2022   Lab Results  Component Value Date   TSH 24.000 (H) 08/11/2022   Lab Results  Component Value Date   CHOL 148 08/11/2022   HDL 26 (L) 08/11/2022   LDLCALC 90 08/11/2022   TRIG 185 (H) 08/11/2022   Lab Results  Component Value Date   VD25OH 46.3 12/13/2022   VD25OH 29.7 (L) 08/11/2022   Lab Results  Component Value Date   WBC 4.7 08/11/2022   HGB 11.9 08/11/2022   HCT 34.2 08/11/2022   MCV 90 08/11/2022   PLT 199 08/11/2022   No results found for: "IRON", "TIBC", "FERRITIN"  Attestation Statements:   Reviewed by clinician on day of visit: allergies, medications, problem list, medical history, surgical history, family history, social history, and previous encounter notes.  I have reviewed the above documentation for accuracy and completeness, and I agree with the above. -  Sally-Anne Wamble d. Quayshaun Hubbert, NP-C

## 2023-04-17 ENCOUNTER — Encounter (INDEPENDENT_AMBULATORY_CARE_PROVIDER_SITE_OTHER): Payer: Self-pay | Admitting: Adult Health

## 2023-04-17 ENCOUNTER — Ambulatory Visit (INDEPENDENT_AMBULATORY_CARE_PROVIDER_SITE_OTHER): Payer: Medicaid Other | Admitting: Adult Health

## 2023-04-17 VITALS — BP 111/75 | HR 84 | Temp 98.4°F | Ht 63.0 in | Wt 260.0 lb

## 2023-04-17 DIAGNOSIS — Z6841 Body Mass Index (BMI) 40.0 and over, adult: Secondary | ICD-10-CM

## 2023-04-17 DIAGNOSIS — R748 Abnormal levels of other serum enzymes: Secondary | ICD-10-CM | POA: Diagnosis not present

## 2023-04-17 DIAGNOSIS — E88819 Insulin resistance, unspecified: Secondary | ICD-10-CM | POA: Diagnosis not present

## 2023-04-17 MED ORDER — WEGOVY 1.7 MG/0.75ML ~~LOC~~ SOAJ: 1.7000 mg | SUBCUTANEOUS | 0 refills | Status: DC

## 2023-04-17 NOTE — Progress Notes (Signed)
WEIGHT SUMMARY AND BIOMETRICS  Vitals Temp: 98.4 F (36.9 C) BP: 111/75 Pulse Rate: 84 SpO2: 96 %   Anthropometric Measurements Height: 5\' 3"  (1.6 m) Weight: 260 lb (117.9 kg) BMI (Calculated): 46.07 Weight at Last Visit: 260lb Weight Lost Since Last Visit: 0 Weight Gained Since Last Visit: 0 Starting Weight: 266lb Total Weight Loss (lbs): 6 lb (2.722 kg)   Body Composition  Body Fat %: 50.9 % Fat Mass (lbs): 132.4 lbs Muscle Mass (lbs): 121.4 lbs Total Body Water (lbs): 93.4 lbs Visceral Fat Rating : 17   Other Clinical Data Fasting: no Labs: no Today's Visit #: 10 Starting Date: 08/11/22    Chief Complaint:   OBESITY Ebony Ortiz is here to discuss her progress with her obesity treatment plan. She is on the the Category 4 Plan and states she is following her eating plan approximately 95 % of the time.  She states she is exercising Walking and Zumba 30 minutes 1-3 times per week.   Interim History:  12/13/2022 started on loading dose Wegovy 0.25mg  01/17/2023 increased Wegovy from 0.25mg  to 0.5mg  once weekly injection 02/14/2023 increased Wegovy from 0.5mg  to 1mg  once weekly injection She endorses increased "food noise" and cravings the last several weeks  He husband underwent surgery for prostrate cancer last year.  Since then they has been unable to have sexual intercourse. Ebony Ortiz also estimates that her last menses was approximately 8 months ago  Reviewed Bioimpedance results with pt: Muscle Mass: +1.8 lbs Adipose Mass: -1.6 lbs   Subjective:   1. Elevated liver enzymes  Latest Reference Range & Units 08/11/22 10:21 12/13/22 08:06  Alkaline Phosphatase 44 - 121 IU/L 49 64  Albumin 3.8 - 4.9 g/dL 4.5 4.4  Albumin/Globulin Ratio 1.2 - 2.2  1.3   AST 0 - 40 IU/L 60 (H) 52 (H)  ALT 0 - 32 IU/L 38 (H) 40 (H)  (H): Data is abnormally high  She denies RUQ pain She is abstinent from ETOH  2. Insulin resistance  Latest Reference Range & Units  08/11/22 10:21 12/13/22 08:06  INSULIN 2.6 - 24.9 uIU/mL 27.7 (H) 52.7 (H)  (H): Data is abnormally high  12/13/2022 started on loading dose Wegovy 0.25mg  01/17/2023 increased Wegovy from 0.25mg  to 0.5mg  once weekly injection 02/14/2023 increased Wegovy from 0.5mg  to 1mg  once weekly injection She endorses increased "food noise" and cravings the last several weeks Denies mass in neck, dysphagia, dyspepsia, persistent hoarseness, abdominal pain, or N/V/C   She estimates to consume > 115g protein per day  Assessment/Plan:   1. Elevated liver enzymes (Primary) Continue with weight loss efforts Monitor Labs  2. Insulin resistance Consume at least 100-125g protein per day Monitor Labs  3. Morbid obesity with body mass index (BMI) of 45.0 to 49.9 in adult [facility]), Current BMI 46.07 Refill and increase Semaglutide-Weight Management (WEGOVY) 1.7 MG/0.75ML SOAJ Inject 1.7 mg into the skin once a week. Dispense: 3 mL, Refills: 0 ordered   Ebony Ortiz is currently in the action stage of change. As such, her goal is to continue with weight loss efforts. She has agreed to the Category 4 Plan.   Exercise goals: For substantial health benefits, adults should do at least 150 minutes (2 hours and 30 minutes) a week of moderate-intensity, or 75 minutes (1 hour and 15 minutes) a week of vigorous-intensity aerobic physical activity, or an equivalent combination of moderate- and vigorous-intensity aerobic activity. Aerobic activity should be performed in episodes of at least 10 minutes, and  preferably, it should be spread throughout the week.  Behavioral modification strategies: increasing lean protein intake, decreasing simple carbohydrates, increasing vegetables, increasing water intake, no skipping meals, meal planning and cooking strategies, keeping healthy foods in the home, and planning for success.  Ebony Ortiz has agreed to follow-up with our clinic in 4 weeks. She was informed of the importance of frequent  follow-up visits to maximize her success with intensive lifestyle modifications for her multiple health conditions.     Objective:   Blood pressure 111/75, pulse 84, temperature 98.4 F (36.9 C), height 5\' 3"  (1.6 m), weight 260 lb (117.9 kg), SpO2 96%. Body mass index is 46.06 kg/m.  General: Cooperative, alert, well developed, in no acute distress. HEENT: Conjunctivae and lids unremarkable. Cardiovascular: Regular rhythm.  Lungs: Normal work of breathing. Neurologic: No focal deficits.   Lab Results  Component Value Date   CREATININE 1.03 (H) 12/13/2022   BUN 18 12/13/2022   NA 139 12/13/2022   K 4.4 12/13/2022   CL 102 12/13/2022   CO2 23 12/13/2022   Lab Results  Component Value Date   ALT 40 (H) 12/13/2022   AST 52 (H) 12/13/2022   ALKPHOS 64 12/13/2022   BILITOT 0.4 12/13/2022   Lab Results  Component Value Date   HGBA1C 5.6 12/13/2022   HGBA1C 5.6 08/11/2022   Lab Results  Component Value Date   INSULIN 52.7 (H) 12/13/2022   INSULIN 27.7 (H) 08/11/2022   Lab Results  Component Value Date   TSH 24.000 (H) 08/11/2022   Lab Results  Component Value Date   CHOL 148 08/11/2022   HDL 26 (L) 08/11/2022   LDLCALC 90 08/11/2022   TRIG 185 (H) 08/11/2022   Lab Results  Component Value Date   VD25OH 46.3 12/13/2022   VD25OH 29.7 (L) 08/11/2022   Lab Results  Component Value Date   WBC 4.7 08/11/2022   HGB 11.9 08/11/2022   HCT 34.2 08/11/2022   MCV 90 08/11/2022   PLT 199 08/11/2022   No results found for: "IRON", "TIBC", "FERRITIN"  Attestation Statements:   Reviewed by clinician on day of visit: allergies, medications, problem list, medical history, surgical history, family history, social history, and previous encounter notes.  I have reviewed the above documentation for accuracy and completeness, and I agree with the above. -  Elye Harmsen d. Trayton Szabo, NP-C

## 2023-05-20 ENCOUNTER — Other Ambulatory Visit (INDEPENDENT_AMBULATORY_CARE_PROVIDER_SITE_OTHER): Payer: Self-pay | Admitting: Adult Health

## 2023-05-24 ENCOUNTER — Ambulatory Visit (INDEPENDENT_AMBULATORY_CARE_PROVIDER_SITE_OTHER): Payer: Medicaid Other | Admitting: Adult Health

## 2023-05-24 ENCOUNTER — Encounter (INDEPENDENT_AMBULATORY_CARE_PROVIDER_SITE_OTHER): Payer: Self-pay | Admitting: Adult Health

## 2023-05-24 VITALS — BP 125/73 | HR 75 | Temp 97.7°F | Ht 63.0 in | Wt 259.0 lb

## 2023-05-24 DIAGNOSIS — F439 Reaction to severe stress, unspecified: Secondary | ICD-10-CM

## 2023-05-24 DIAGNOSIS — R748 Abnormal levels of other serum enzymes: Secondary | ICD-10-CM | POA: Diagnosis not present

## 2023-05-24 DIAGNOSIS — E88819 Insulin resistance, unspecified: Secondary | ICD-10-CM

## 2023-05-24 DIAGNOSIS — Z6841 Body Mass Index (BMI) 40.0 and over, adult: Secondary | ICD-10-CM

## 2023-05-24 MED ORDER — VITAMIN D (ERGOCALCIFEROL) 1.25 MG (50000 UNIT) PO CAPS: 50000.0000 [IU] | ORAL_CAPSULE | ORAL | 0 refills | Status: DC

## 2023-05-24 MED ORDER — WEGOVY 1.7 MG/0.75ML ~~LOC~~ SOAJ: 1.7000 mg | SUBCUTANEOUS | 0 refills | Status: DC

## 2023-05-24 NOTE — Progress Notes (Signed)
WEIGHT SUMMARY AND BIOMETRICS  Vitals Temp: 97.7 F (36.5 C) BP: 125/73 Pulse Rate: 75 SpO2: 100 %   Anthropometric Measurements Height: 5\' 3"  (1.6 m) Weight: 259 lb (117.5 kg) BMI (Calculated): 45.89 Weight at Last Visit: 260lb Weight Lost Since Last Visit: 1lb Weight Gained Since Last Visit: 0 Starting Weight: 266lb Total Weight Loss (lbs): 7 lb (3.175 kg)   Body Composition  Body Fat %: 51.1 % Fat Mass (lbs): 132.8 lbs Muscle Mass (lbs): 120.6 lbs Total Body Water (lbs): 92.6 lbs Visceral Fat Rating : 18   Other Clinical Data Fasting: no Labs: no Today's Visit #: 11 Starting Date: 08/11/22    Chief Complaint:   OBESITY Ebony Ortiz is here to discuss her progress with her obesity treatment plan. She is on the the Category 4 Plan and states she is following her eating plan approximately 60 % of the time.  She states she is exercising: None   Interim History:  She has been unable to exercise hectic/demanding schedule She has been trying to increase protein at meals and snacks. She recently went to [facility] and "stocked up" on protein options.  Stress- she endorses significant increase in stress, r/t to family dynamics  Exercise-None  Hydration-she estimates to drink 100-120 oz water/day  Subjective:   1. Elevated liver enzymes  Latest Reference Range & Units 12/13/22 08:06  Alkaline Phosphatase 44 - 121 IU/L 64  Albumin 3.8 - 4.9 g/dL 4.4  AST 0 - 40 IU/L 52 (H)  ALT 0 - 32 IU/L 40 (H)  (H): Data is abnormally high  She denies RUQ pain She denies any ETOH use  2. Insulin resistance  Latest Reference Range & Units 12/13/22 08:06  Glucose 70 - 99 mg/dL 161 (H)  Hemoglobin W9U 4.8 - 5.6 % 5.6  Est. average glucose Bld gHb Est-mCnc mg/dL 045  INSULIN 2.6 - 40.9 uIU/mL 52.7 (H)  (H): Data is abnormally high  12/13/2022 started on loading dose Wegovy 0.25mg  01/17/2023 increased Wegovy from 0.25mg  to 0.5mg  once weekly injection 02/14/2023 increased  Wegovy from 0.5mg  to 1mg  once weekly injection 04/17/2023 increased Wegovy from 1mg  to 1.7 mg weekly injection  Denies mass in neck, dysphagia, dyspepsia, persistent hoarseness, abdominal pain, or N/V/C   3. Vit D Def  Latest Reference Range & Units 08/11/22 10:21 12/13/22 08:06  Vitamin D, 25-Hydroxy 30.0 - 100.0 ng/mL 29.7 (L) 46.3  (L): Data is abnormally low  She is on weekly Ergocalciferol- denies N/V/Muscle Weakness  4. Stress at home Ms. Cockerell provided the following family hx: Her father passed away 5 years ago. At her father's funeral, she had a falling out with her brother, Ebony Ortiz age 9. Her mother is still living age 3 at a retirement community in [place] in [place]. Her mother was recently in a MVC. Her mother is complaining of upper extremity pain and inability to drive her rental car. Ms. Schilke- is in charge of medical decisions for her mother Ebony Ortiz is in charge of financial decision for her mother. Ebony Ortiz is not helpful and her relationship with her mother is strained.  Assessment/Plan:   1. Elevated liver enzymes Continue with weight loss efforts  2. Insulin resistance Continue with Cat 4 Meal Plan and increase daily activity. Continue Wegovy 1.7mg  weekly injection  3. Vit D Def Refill Vitamin D, Ergocalciferol, (DRISDOL) 1.25 MG (50000 UNIT) CAPS capsule Take 1 capsule (50,000 Units total) by mouth every 7 (seven) days. Dispense: 12 capsule, Refills: 0 ordered  4. Stress at home Surround self with positive family/friends Try to set up boundaries with her mother  5. Morbid obesity with body mass index (BMI) of 45.0 to 49.9 in adult [facility]), Current BMI 45.89 Refill Semaglutide-Weight Management (WEGOVY) 1.7 MG/0.75ML SOAJ Inject 1.7 mg into the skin once a week. Dispense: 3 mL, Refills: 0 ordered   Ebony Ortiz is currently in the action stage of change. As such, her goal is to continue with weight loss efforts. She has agreed to the Category 4 Plan.   Exercise  goals: All adults should avoid inactivity. Some physical activity is better than none, and adults who participate in any amount of physical activity gain some health benefits. Adults should also include muscle-strengthening activities that involve all major muscle groups on 2 or more days a week.  Behavioral modification strategies: increasing lean protein intake, decreasing simple carbohydrates, increasing vegetables, increasing water intake, no skipping meals, meal planning and cooking strategies, keeping healthy foods in the home, ways to avoid boredom eating, and planning for success.  Ebony Ortiz has agreed to follow-up with our clinic in 4 weeks. She was informed of the importance of frequent follow-up visits to maximize her success with intensive lifestyle modifications for her multiple health conditions.   Check Fasting Labs at next OV  Objective:   Blood pressure 125/73, pulse 75, temperature 97.7 F (36.5 C), height 5\' 3"  (1.6 m), weight 259 lb (117.5 kg), SpO2 100%. Body mass index is 45.88 kg/m.  General: Cooperative, alert, well developed, in no acute distress. HEENT: Conjunctivae and lids unremarkable. Cardiovascular: Regular rhythm.  Lungs: Normal work of breathing. Neurologic: No focal deficits.   Lab Results  Component Value Date   CREATININE 1.03 (H) 12/13/2022   BUN 18 12/13/2022   NA 139 12/13/2022   K 4.4 12/13/2022   CL 102 12/13/2022   CO2 23 12/13/2022   Lab Results  Component Value Date   ALT 40 (H) 12/13/2022   AST 52 (H) 12/13/2022   ALKPHOS 64 12/13/2022   BILITOT 0.4 12/13/2022   Lab Results  Component Value Date   HGBA1C 5.6 12/13/2022   HGBA1C 5.6 08/11/2022   Lab Results  Component Value Date   INSULIN 52.7 (H) 12/13/2022   INSULIN 27.7 (H) 08/11/2022   Lab Results  Component Value Date   TSH 24.000 (H) 08/11/2022   Lab Results  Component Value Date   CHOL 148 08/11/2022   HDL 26 (L) 08/11/2022   LDLCALC 90 08/11/2022   TRIG 185 (H)  08/11/2022   Lab Results  Component Value Date   VD25OH 46.3 12/13/2022   VD25OH 29.7 (L) 08/11/2022   Lab Results  Component Value Date   WBC 4.7 08/11/2022   HGB 11.9 08/11/2022   HCT 34.2 08/11/2022   MCV 90 08/11/2022   PLT 199 08/11/2022   No results found for: "IRON", "TIBC", "FERRITIN"  Attestation Statements:   Reviewed by clinician on day of visit: allergies, medications, problem list, medical history, surgical history, family history, social history, and previous encounter notes.  I have reviewed the above documentation for accuracy and completeness, and I agree with the above. -  Ebony Ortiz d. Ramonda Galyon, NP-C

## 2023-06-22 ENCOUNTER — Ambulatory Visit (INDEPENDENT_AMBULATORY_CARE_PROVIDER_SITE_OTHER): Payer: Medicaid Other | Admitting: Adult Health

## 2023-06-22 ENCOUNTER — Encounter (INDEPENDENT_AMBULATORY_CARE_PROVIDER_SITE_OTHER): Payer: Self-pay | Admitting: Adult Health

## 2023-06-22 VITALS — BP 120/82 | HR 80 | Temp 98.3°F | Ht 63.0 in | Wt 256.0 lb

## 2023-06-22 DIAGNOSIS — E559 Vitamin D deficiency, unspecified: Secondary | ICD-10-CM | POA: Diagnosis not present

## 2023-06-22 DIAGNOSIS — E7800 Pure hypercholesterolemia, unspecified: Secondary | ICD-10-CM | POA: Diagnosis not present

## 2023-06-22 DIAGNOSIS — E88819 Insulin resistance, unspecified: Secondary | ICD-10-CM | POA: Diagnosis not present

## 2023-06-22 DIAGNOSIS — F439 Reaction to severe stress, unspecified: Secondary | ICD-10-CM | POA: Diagnosis not present

## 2023-06-22 DIAGNOSIS — Z6841 Body Mass Index (BMI) 40.0 and over, adult: Secondary | ICD-10-CM

## 2023-06-22 MED ORDER — WEGOVY 1.7 MG/0.75ML ~~LOC~~ SOAJ: 1.7000 mg | SUBCUTANEOUS | 0 refills | Status: DC

## 2023-06-22 NOTE — Progress Notes (Signed)
 WEIGHT SUMMARY AND BIOMETRICS  Vitals Temp: 98.3 F (36.8 C) BP: 120/82 Pulse Rate: 80 SpO2: 98 %   Anthropometric Measurements Height: 5\' 3"  (1.6 m) Weight: 256 lb (116.1 kg) BMI (Calculated): 45.36 Weight at Last Visit: 259 lb Weight Lost Since Last Visit: 4 lb Weight Gained Since Last Visit: 0 lb Starting Weight: 266 lb Total Weight Loss (lbs): 11 lb (4.99 kg)   Body Composition  Body Fat %: 49.9 % Fat Mass (lbs): 127.4 lbs Muscle Mass (lbs): 121.6 lbs Total Body Water (lbs): 89.2 lbs Visceral Fat Rating : 17   Other Clinical Data Fasting: yes Labs: yes Today's Visit #: 12 Starting Date: 08/11/22    Chief Complaint:   OBESITY Ebony Ortiz is here to discuss her progress with her obesity treatment plan.  She is on the the Category 4 Plan and states she is following her eating plan approximately 70 % of the time.  She states she is exercising: None   Interim History:  12/13/2022 started on loading dose Wegovy 0.25mg  01/17/2023 increased Wegovy from 0.25mg  to 0.5mg  once weekly injection 02/14/2023 increased Wegovy from 0.5mg  to 1mg  once weekly injection 04/17/2023 increased Wegovy from 1mg  to 1.7 mg weekly injection   Hunger/appetite-she reports stable appetite levels  Exercise-unable to find time during day as work schedule is full  Hydration-she estimates to drink 100-120 oz water/day  Subjective:   1. Insulin resistance  Latest Reference Range & Units 08/11/22 10:21 12/13/22 08:06  INSULIN 2.6 - 24.9 uIU/mL 27.7 (H) 52.7 (H)  (H): Data is abnormally high  12/13/2022 started on loading dose Wegovy 0.25mg  01/17/2023 increased Wegovy from 0.25mg  to 0.5mg  once weekly injection 02/14/2023 increased Wegovy from 0.5mg  to 1mg  once weekly injection 04/17/2023 increased Wegovy from 1mg  to 1.7 mg weekly injection  2. Stress at home Work continues to be a large source of stress. She will often wake up several times during the night to check her work  messages. She has been unable to exercise the last month due to lack of time due to work demands  3. Vitamin D deficiency  Latest Reference Range & Units 08/11/22 10:21 12/13/22 08:06  Vitamin D, 25-Hydroxy 30.0 - 100.0 ng/mL 29.7 (L) 46.3  (L): Data is abnormally low  She is on weekly Ergocalciferol-she denies N/V/Muscle Weakness  4. Elevated cholesterol Lipid Panel     Component Value Date/Time   CHOL 148 08/11/2022 1021   TRIG 185 (H) 08/11/2022 1021   HDL 26 (L) 08/11/2022 1021   LDLCALC 90 08/11/2022 1021   LABVLDL 32 08/11/2022 1021    She is not on statin therapy She is on daily Tricor 145mg    Assessment/Plan:   1. Insulin resistance (Primary) Check Labs  2. Stress at home Increase regular exercise  3. Vitamin D deficiency Check Labs  4. Elevated cholesterol Check Labs Reduce sat fat intake  5. Morbid obesity with body mass index (BMI) of 45.0 to 49.9 in adult [facility]), Current BMI 45.2 Refill  Semaglutide-Weight Management (WEGOVY) 1.7 MG/0.75ML SOAJ Inject 1.7 mg into the skin once a week. Dispense: 3 mL, Refills: 0 ordered   Ebony Ortiz is currently in the action stage of change. As such, her goal is to continue with weight loss efforts. She has agreed to the Category 4 Plan.   Lunch: 550-650 cal with 40g+ protein  Exercise goals: All adults should avoid inactivity. Some physical activity is better than none, and adults who participate in any amount of physical activity gain some  health benefits. Adults should also include muscle-strengthening activities that involve all major muscle groups on 2 or more days a week.  Behavioral modification strategies: increasing lean protein intake, decreasing simple carbohydrates, increasing vegetables, increasing water intake, no skipping meals, meal planning and cooking strategies, keeping healthy foods in the home, ways to avoid boredom eating, and planning for success.  Ebony Ortiz has agreed to follow-up with our clinic in 4 weeks.  She was informed of the importance of frequent follow-up visits to maximize her success with intensive lifestyle modifications for her multiple health conditions.   Ebony Ortiz was informed we would discuss her lab results at her next visit unless there is a critical issue that needs to be addressed sooner. Ebony Ortiz agreed to keep her next visit at the agreed upon time to discuss these results.  Objective:   Blood pressure 120/82, pulse 80, temperature 98.3 F (36.8 C), height 5\' 3"  (1.6 m), weight 256 lb (116.1 kg), SpO2 98%. Body mass index is 45.35 kg/m.  General: Cooperative, alert, well developed, in no acute distress. HEENT: Conjunctivae and lids unremarkable. Cardiovascular: Regular rhythm.  Lungs: Normal work of breathing. Neurologic: No focal deficits.   Lab Results  Component Value Date   CREATININE 1.03 (H) 12/13/2022   BUN 18 12/13/2022   NA 139 12/13/2022   K 4.4 12/13/2022   CL 102 12/13/2022   CO2 23 12/13/2022   Lab Results  Component Value Date   ALT 40 (H) 12/13/2022   AST 52 (H) 12/13/2022   ALKPHOS 64 12/13/2022   BILITOT 0.4 12/13/2022   Lab Results  Component Value Date   HGBA1C 5.6 12/13/2022   HGBA1C 5.6 08/11/2022   Lab Results  Component Value Date   INSULIN 52.7 (H) 12/13/2022   INSULIN 27.7 (H) 08/11/2022   Lab Results  Component Value Date   TSH 24.000 (H) 08/11/2022   Lab Results  Component Value Date   CHOL 148 08/11/2022   HDL 26 (L) 08/11/2022   LDLCALC 90 08/11/2022   TRIG 185 (H) 08/11/2022   Lab Results  Component Value Date   VD25OH 46.3 12/13/2022   VD25OH 29.7 (L) 08/11/2022   Lab Results  Component Value Date   WBC 4.7 08/11/2022   HGB 11.9 08/11/2022   HCT 34.2 08/11/2022   MCV 90 08/11/2022   PLT 199 08/11/2022   No results found for: "IRON", "TIBC", "FERRITIN"  Attestation Statements:   Reviewed by clinician on day of visit: allergies, medications, problem list, medical history, surgical history, family history,  social history, and previous encounter notes.  I have reviewed the above documentation for accuracy and completeness, and I agree with the above. -  Shelbi Vaccaro d. Kollin Udell, NP-C

## 2023-06-23 LAB — COMPREHENSIVE METABOLIC PANEL
ALT: 45 [IU]/L — ABNORMAL HIGH (ref 0–32)
AST: 47 [IU]/L — ABNORMAL HIGH (ref 0–40)
Albumin: 4.7 g/dL (ref 3.8–4.9)
Alkaline Phosphatase: 58 [IU]/L (ref 44–121)
BUN/Creatinine Ratio: 24 — ABNORMAL HIGH (ref 9–23)
BUN: 24 mg/dL (ref 6–24)
Bilirubin Total: 0.4 mg/dL (ref 0.0–1.2)
CO2: 23 mmol/L (ref 20–29)
Calcium: 9.2 mg/dL (ref 8.7–10.2)
Chloride: 100 mmol/L (ref 96–106)
Creatinine, Ser: 1 mg/dL (ref 0.57–1.00)
Globulin, Total: 3.7 g/dL (ref 1.5–4.5)
Glucose: 85 mg/dL (ref 70–99)
Potassium: 4.4 mmol/L (ref 3.5–5.2)
Sodium: 136 mmol/L (ref 134–144)
Total Protein: 8.4 g/dL (ref 6.0–8.5)
eGFR: 67 mL/min/{1.73_m2} (ref >59)

## 2023-06-23 LAB — LIPID PANEL
Chol/HDL Ratio: 5.3 {ratio} — ABNORMAL HIGH (ref 0.0–4.4)
Cholesterol, Total: 139 mg/dL (ref 100–199)
HDL: 26 mg/dL — ABNORMAL LOW (ref >39)
LDL Chol Calc (NIH): 87 mg/dL (ref 0–99)
Triglycerides: 146 mg/dL (ref 0–149)
VLDL Cholesterol Cal: 26 mg/dL (ref 5–40)

## 2023-06-23 LAB — HEMOGLOBIN A1C
Est. average glucose Bld gHb Est-mCnc: 103 mg/dL
Hgb A1c MFr Bld: 5.2 % (ref 4.8–5.6)

## 2023-06-23 LAB — VITAMIN D 25 HYDROXY (VIT D DEFICIENCY, FRACTURES): Vit D, 25-Hydroxy: 53.7 ng/mL (ref 30.0–100.0)

## 2023-06-23 LAB — INSULIN, RANDOM: INSULIN: 31.4 u[IU]/mL — ABNORMAL HIGH (ref 2.6–24.9)

## 2023-07-20 ENCOUNTER — Encounter (INDEPENDENT_AMBULATORY_CARE_PROVIDER_SITE_OTHER): Payer: Self-pay | Admitting: Adult Health

## 2023-07-20 ENCOUNTER — Other Ambulatory Visit (HOSPITAL_COMMUNITY): Payer: Self-pay

## 2023-07-20 ENCOUNTER — Ambulatory Visit (INDEPENDENT_AMBULATORY_CARE_PROVIDER_SITE_OTHER): Payer: Medicaid Other | Admitting: Adult Health

## 2023-07-20 VITALS — BP 119/80 | HR 85 | Temp 97.8°F | Ht 63.0 in | Wt 257.0 lb

## 2023-07-20 DIAGNOSIS — E88819 Insulin resistance, unspecified: Secondary | ICD-10-CM

## 2023-07-20 DIAGNOSIS — R748 Abnormal levels of other serum enzymes: Secondary | ICD-10-CM | POA: Diagnosis not present

## 2023-07-20 DIAGNOSIS — E559 Vitamin D deficiency, unspecified: Secondary | ICD-10-CM

## 2023-07-20 DIAGNOSIS — E7800 Pure hypercholesterolemia, unspecified: Secondary | ICD-10-CM | POA: Diagnosis not present

## 2023-07-20 DIAGNOSIS — Z6841 Body Mass Index (BMI) 40.0 and over, adult: Secondary | ICD-10-CM

## 2023-07-20 MED ORDER — WEGOVY 2.4 MG/0.75ML ~~LOC~~ SOAJ
2.4000 mg | SUBCUTANEOUS | 0 refills | Status: DC
  Filled 2023-07-20: qty 6, 56d supply, fill #0
  Filled 2023-07-20: qty 6, 84d supply, fill #0

## 2023-07-20 MED ORDER — WEGOVY 2.4 MG/0.75ML ~~LOC~~ SOAJ: 2.4000 mg | SUBCUTANEOUS | 0 refills | Status: DC

## 2023-07-20 NOTE — Progress Notes (Signed)
 WEIGHT SUMMARY AND BIOMETRICS  Vitals Temp: 97.8 F (36.6 C) BP: 119/80 Pulse Rate: 85 SpO2: 98 %   Anthropometric Measurements Height: 5\' 3"  (1.6 m) Weight: 257 lb (116.6 kg) BMI (Calculated): 45.54 Weight at Last Visit: 256lb Weight Lost Since Last Visit: 0 lb Weight Gained Since Last Visit: 1 lb Starting Weight: 266 lb Total Weight Loss (lbs): 10 lb (4.536 kg)   Body Composition  Body Fat %: 51.2 % Fat Mass (lbs): 132 lbs Muscle Mass (lbs): 119.4 lbs Total Body Water (lbs): 93.4 lbs Visceral Fat Rating : 18   Other Clinical Data Fasting: no Labs: no Today's Visit #: 13 Starting Date: 08/11/22    Chief Complaint:   OBESITY Ebony Ortiz is here to discuss her progress with her obesity treatment plan.  She is on the the Category 4 Plan and states she is following her eating plan approximately 100 % of the time.  She states she is exercising Walking/Zumba 60+ minutes 4 times per week.   Interim History:  Current weight 257 lbs Interval  Goal to loss <230 lbs  Bioimpedance results with pt: Muscle Mass:-2.2 lbs Adipose Mass: +4.6 lbs  Hydration-She estimates to drink at least 120-130 oz water/day  She has increased exercise intensity and duration.  She is on weekly Wegovy 1.7mg  - injects on Sat. She is really frustrated with lack of weight loss. She has been on 1.7mg  strength since on/about 04/17/2023  Subjective:   1. Elevated liver enzymes Discussed Labs  Latest Reference Range & Units 06/22/23 11:01  Alkaline Phosphatase 44 - 121 IU/L 58  Albumin 3.8 - 4.9 g/dL 4.7  AST 0 - 40 IU/L 47 (H)  ALT 0 - 32 IU/L 45 (H)  (H): Data is abnormally high  ALT/AST still above goal She denies RUQ She denies frequent ETOH use  2. Elevated cholesterol Discussed Labs Lipid Panel     Component Value Date/Time   CHOL 139 06/22/2023 1106   TRIG 146 06/22/2023 1106   HDL 26 (L) 06/22/2023 1106   CHOLHDL 5.3 (H) 06/22/2023 1106   LDLCALC 87 06/22/2023  1106   LABVLDL 26 06/22/2023 1106    Tot, TGs, LDL at goal HDL continued to remain below goal. She endorses familial HDL deficiency PCP/Dr. Greggory Stallion manages Tricor 145 mg She estimates to drink at least 120-130 oz water/day  3. Insulin resistance Discussed Labs  Latest Reference Range & Units 06/22/23 11:01  Glucose 70 - 99 mg/dL 85  Hemoglobin I6N 4.8 - 5.6 % 5.2  Est. average glucose Bld gHb Est-mCnc mg/dL 629  INSULIN 2.6 - 52.8 uIU/mL 31.4 (H)  (H): Data is abnormally high  12/13/2022 started on loading dose Wegovy 0.25mg  01/17/2023 increased Wegovy from 0.25mg  to 0.5mg  once weekly injection 02/14/2023 increased Wegovy from 0.5mg  to 1mg  once weekly injection 04/17/2023 increased Wegovy from 1mg  to 1.7 mg weekly injection Denies mass in neck, dysphagia, dyspepsia, persistent hoarseness, abdominal pain, or N/V/C  Discussed risks/benefits of increasing Wegovy 1.7mg  to 2.4mg - she is agreeable to increase dose  4. Vitamin D deficiency Discussed Labs  Latest Reference Range & Units 06/22/23 11:01  Vitamin D, 25-Hydroxy 30.0 - 100.0 ng/mL 53.7   Continue weekly Ergocalciferol- denies N/V/Muscle Weakness   Assessment/Plan:   1. Elevated liver enzymes Continue with weight loss efforts  2. Elevated cholesterol Continue to follow Prescribed Cat 4 Meal Plan and regular exercise  3. Insulin resistance (Primary) Continue to follow Prescribed Cat 4 Meal Plan and regular exercise  4.  Vitamin D deficiency Continue weekly Ergocalciferol- denies needs for refill today  6. Morbid obesity with body mass index (BMI) of 45.0 to 49.9 in adult [facility]), Current BMI 45.7 Refill and INCREASE  Semaglutide-Weight Management (WEGOVY) 2.4 MG/0.75ML SOAJ Inject 2.4 mg into the skin once a week. Dispense: 6 mL, Refills: 0 ordered   Ebony Ortiz is currently in the action stage of change. As such, her goal is to continue with weight loss efforts. She has agreed to the Category 4 Plan.   Exercise goals: For  substantial health benefits, adults should do at least 150 minutes (2 hours and 30 minutes) a week of moderate-intensity, or 75 minutes (1 hour and 15 minutes) a week of vigorous-intensity aerobic physical activity, or an equivalent combination of moderate- and vigorous-intensity aerobic activity. Aerobic activity should be performed in episodes of at least 10 minutes, and preferably, it should be spread throughout the week.  Behavioral modification strategies: increasing lean protein intake, decreasing simple carbohydrates, increasing vegetables, increasing water intake, no skipping meals, meal planning and cooking strategies, keeping healthy foods in the home, ways to avoid boredom eating, and planning for success.  Ebony Ortiz has agreed to follow-up with our clinic in 4 weeks. She was informed of the importance of frequent follow-up visits to maximize her success with intensive lifestyle modifications for her multiple health conditions.   Check IC at next OV- pt aware to arrive 30 mins early and to be fasting   Objective:   Blood pressure 119/80, pulse 85, temperature 97.8 F (36.6 C), height 5\' 3"  (1.6 m), weight 257 lb (116.6 kg), SpO2 98%. Body mass index is 45.53 kg/m.  General: Cooperative, alert, well developed, in no acute distress. HEENT: Conjunctivae and lids unremarkable. Cardiovascular: Regular rhythm.  Lungs: Normal work of breathing. Neurologic: No focal deficits.   Lab Results  Component Value Date   CREATININE 1.00 06/22/2023   BUN 24 06/22/2023   NA 136 06/22/2023   K 4.4 06/22/2023   CL 100 06/22/2023   CO2 23 06/22/2023   Lab Results  Component Value Date   ALT 45 (H) 06/22/2023   AST 47 (H) 06/22/2023   ALKPHOS 58 06/22/2023   BILITOT 0.4 06/22/2023   Lab Results  Component Value Date   HGBA1C 5.2 06/22/2023   HGBA1C 5.6 12/13/2022   HGBA1C 5.6 08/11/2022   Lab Results  Component Value Date   INSULIN 31.4 (H) 06/22/2023   INSULIN 52.7 (H) 12/13/2022    INSULIN 27.7 (H) 08/11/2022   Lab Results  Component Value Date   TSH 24.000 (H) 08/11/2022   Lab Results  Component Value Date   CHOL 139 06/22/2023   HDL 26 (L) 06/22/2023   LDLCALC 87 06/22/2023   TRIG 146 06/22/2023   CHOLHDL 5.3 (H) 06/22/2023   Lab Results  Component Value Date   VD25OH 53.7 06/22/2023   VD25OH 46.3 12/13/2022   VD25OH 29.7 (L) 08/11/2022   Lab Results  Component Value Date   WBC 4.7 08/11/2022   HGB 11.9 08/11/2022   HCT 34.2 08/11/2022   MCV 90 08/11/2022   PLT 199 08/11/2022   No results found for: "IRON", "TIBC", "FERRITIN"  Attestation Statements:   Reviewed by clinician on day of visit: allergies, medications, problem list, medical history, surgical history, family history, social history, and previous encounter notes.  I have reviewed the above documentation for accuracy and completeness, and I agree with the above. -  Shakya Sebring d. Nai Borromeo, NP-C

## 2023-08-15 ENCOUNTER — Other Ambulatory Visit: Payer: Self-pay | Admitting: Certified Nurse Midwife

## 2023-08-15 DIAGNOSIS — Z1231 Encounter for screening mammogram for malignant neoplasm of breast: Secondary | ICD-10-CM

## 2023-08-24 ENCOUNTER — Ambulatory Visit (INDEPENDENT_AMBULATORY_CARE_PROVIDER_SITE_OTHER): Admitting: Family Medicine

## 2023-08-24 ENCOUNTER — Other Ambulatory Visit: Payer: Self-pay

## 2023-08-24 ENCOUNTER — Encounter (INDEPENDENT_AMBULATORY_CARE_PROVIDER_SITE_OTHER): Payer: Self-pay | Admitting: Family Medicine

## 2023-08-24 ENCOUNTER — Other Ambulatory Visit (HOSPITAL_COMMUNITY): Payer: Self-pay

## 2023-08-24 VITALS — BP 114/76 | HR 80 | Temp 98.6°F | Ht 63.0 in | Wt 250.0 lb

## 2023-08-24 DIAGNOSIS — R0602 Shortness of breath: Secondary | ICD-10-CM | POA: Diagnosis not present

## 2023-08-24 DIAGNOSIS — Z6841 Body Mass Index (BMI) 40.0 and over, adult: Secondary | ICD-10-CM

## 2023-08-24 DIAGNOSIS — K760 Fatty (change of) liver, not elsewhere classified: Secondary | ICD-10-CM | POA: Diagnosis not present

## 2023-08-24 DIAGNOSIS — E559 Vitamin D deficiency, unspecified: Secondary | ICD-10-CM

## 2023-08-24 MED ORDER — WEGOVY 2.4 MG/0.75ML ~~LOC~~ SOAJ
2.4000 mg | SUBCUTANEOUS | 0 refills | Status: DC
  Filled 2023-08-24 – 2023-08-30 (×4): qty 6, 56d supply, fill #0
  Filled 2023-08-30: qty 3, 28d supply, fill #0

## 2023-08-24 MED ORDER — VITAMIN D (ERGOCALCIFEROL) 1.25 MG (50000 UNIT) PO CAPS
50000.0000 [IU] | ORAL_CAPSULE | ORAL | 0 refills | Status: DC
  Filled 2023-08-24: qty 12, 84d supply, fill #0

## 2023-08-24 NOTE — Progress Notes (Addendum)
 Ebony Ortiz, D.O.  ABFM, ABOM Specializing in Clinical Bariatric Medicine  Office located at: [address] [place], [place]  [address]   Assessment and Plan:   Medications Discontinued During This Encounter  Medication Reason   Vitamin D , Ergocalciferol , (DRISDOL ) 1.25 MG (50000 UNIT) CAPS capsule Reorder   Semaglutide -Weight Management (WEGOVY ) 2.4 MG/0.75ML SOAJ Reorder     Meds ordered this encounter  Medications   Vitamin D , Ergocalciferol , (DRISDOL ) 1.25 MG (50000 UNIT) CAPS capsule    Sig: Take 1 capsule (50,000 Units total) by mouth every 7 (seven) days.    Dispense:  12 capsule    Refill:  0   Semaglutide -Weight Management (WEGOVY ) 2.4 MG/0.75ML SOAJ    Sig: Inject 2.4 mg into the skin once a week.    Dispense:  6 mL    Refill:  0   FOR THE DISEASE OF OBESITY:  BMI 45.0-49.9, adult (HCC), Starting BMI 46.07 Morbid obesity with body mass index (BMI) of 45.0 to 49.9 in adult [facility]), Current BMI 44.3 Assessment & Plan: Since last office visit on 07/20/2023 patient's  Muscle mass has increased by 0.2 lb. Fat mass has decreased by 8 lb. Total body water has decreased by 3.8 lb.  Counseling done on how various foods will affect these numbers and how to maximize success  Total lbs lost to date: 20 lbs  Total weight loss percentage to date: 7.40%    Recommended Dietary Goals Ebony Ortiz is currently in the action stage of change. As such, her goal is to continue weight management plan.  She has agreed to: continue journaling and CAT 4 MP.    Behavioral Intervention We discussed the following today: increasing lower glycemic fruits, increasing water intake , and continue to work on implementation of reduced calorie nutritional plan  Additional resources provided today: Handout on low glycemic and low calorie fruits and veggies and Handout on Common Characteristics of Successful Weight Losers, Handout on Adaptive Thermogenesis.   Evidence-based interventions for  health behavior change were utilized today including the discussion of self monitoring techniques, problem-solving barriers and SMART goal setting techniques.   Regarding patient's less desirable eating habits and patterns, we employed the technique of small changes.   Pt will specifically work on: n/a   Recommended Physical Activity Goals Ebony Ortiz has been advised to work up to 300-450 minutes of moderate intensity aerobic activity a week and strengthening exercises 2-3 times per week for cardiovascular health, weight loss maintenance and preservation of muscle mass.   She has agreed to :  consider adding strength training 2-3 days a week to current exercise regimen   Pharmacotherapy LOV, Katy increased her Wegovy  from 1.7 mg to 2.4 mg weekly. Denies gastrointestinal issues. Her hunger and cravings are controlled. Continue regimen.    FOR ASSOCIATED CONDITIONS ADDRESSED TODAY:  SOB (shortness of breath) on exertion Assessment & Plan: Pt states she gets out of breath more easily with climbing 2 flights of stairs and feels that this would improve with weight loss.  Denies shortness of breath at rest or orthopnea.  Repeat IC completed today to help guide our dietary regimen. It shows a VO2 of 330 and a REE of 2275 (in comparison to REE of 2534 on 08/11/22). Her calculated basal metabolic rate is 0272 thus her resting energy expenditure is better than expected. Reviewed aspects of diet and lifestyle today with patient that will increase metabolism. No change to meal plan warranted at this time.  All questions addressed.  Continue to focus on lean proteins, adequate nutrition and adding cardiovascular and resistance training as tolerated.    Metabolic dysfunction-associated steatotic liver disease (MASLD) Assessment & Plan:    Component Value Date/Time   PROT 8.4 06/22/2023 1101   ALBUMIN 4.7 06/22/2023 1101   AST 47 (H) 06/22/2023 1101   ALT 45 (H) 06/22/2023 1101   ALKPHOS 58 06/22/2023  1101   BILITOT 0.4 06/22/2023 1101    ALT/AST still above goal. Since starting our program in 08/11/22, her body fat % has only improved from 50.9% to 49.6% - 1.3% down.   Educated pt that we are unlikely to see a significant improvement in her liver enzymes until a 7-10% loss of body weight occurs.   Recommend she continue with weight loss efforts and avoid ETOH.   Pt asking if further work-up is needed regarding these elevations... I explained she can discuss with her PCP if she would like, but I did not think an US  was necessary at this time and she should focus on eating healthy and losing body fat %.   Once a significant lose occurs, if we don't see an improvement, then further w/up may be warranted.   F/up with PCP for additional questions/ concerns.  Told pt this will be communicated to her PCP   Vitamin D  deficiency Assessment & Plan: Most recent Vitamin D :  Lab Results  Component Value Date   VD25OH 53.7 06/22/2023   VD25OH 46.3 12/13/2022   VD25OH 29.7 (L) 08/11/2022   Reports compliance with ergocalciferol  50,000 units weekly. Continue vitamin D  supplementation - recheck levels in the future.   Follow up:   Return 09/18/2023. She was informed of the importance of frequent follow up visits to maximize her success with intensive lifestyle modifications for her multiple health conditions.  Subjective:   Chief complaint: Obesity Ebony Ortiz is here to discuss her progress with her obesity treatment plan. She is on the Category 4 Plan and journaling her intake and states she is following her eating plan approximately 100% of the time. She states she is walking 3-4 miles 90 minutes 5 days per week.  Interval History:  Ebony Ortiz is here for a follow up office visit. First appt with pt; she is typically seen by my colleague Acie Holiday. Since last OV on 07/20/2023, she is down 7 lbs. Is drinking 130+ ounces of water daily. Is journaling her intake; averages 2000-2100 calories and 132 grams protein  daily. Hunger and cravings controlled.   Pharmacotherapy for weight loss: She is currently taking  Wegovy  2.4 mg weekly .   Review of Systems:  Pertinent positives were addressed with patient today.  Reviewed by clinician on day of visit: allergies, medications, problem list, medical history, surgical history, family history, social history, and previous encounter notes.  Weight Summary and Biometrics   Weight Lost Since Last Visit: 7lb  Weight Gained Since Last Visit: 0lb   Vitals Temp: 98.6 F (37 C) BP: 114/76 Pulse Rate: 80 SpO2: 100 %   Anthropometric Measurements Height: 5\' 3"  (1.6 m) Weight: 250 lb (113.4 kg) BMI (Calculated): 44.3 Weight at Last Visit: 257lb Weight Lost Since Last Visit: 7lb Weight Gained Since Last Visit: 0lb Starting Weight: 266lb Total Weight Loss (lbs): 17 lb (7.711 kg)   Body Composition  Body Fat %: 49.6 % Fat Mass (lbs): 124 lbs Muscle Mass (lbs): 119.6 lbs Total Body Water (lbs): 89.6 lbs Visceral Fat Rating : 17   Other Clinical Data RMR: 2275 Fasting: Yes Labs:  No Today's Visit #: 14 Starting Date: 08/11/22   Objective:   PHYSICAL EXAM: Blood pressure 114/76, pulse 80, temperature 98.6 F (37 C), height 5\' 3"  (1.6 m), weight 250 lb (113.4 kg), SpO2 100%. Body mass index is 44.29 kg/m.  General: she is overweight, cooperative and in no acute distress. PSYCH: Has normal mood, affect and thought process.   HEENT: EOMI, sclerae are anicteric. Lungs: Normal breathing effort, no conversational dyspnea. Extremities: Moves * 4 Neurologic: A and O * 3, good insight  DIAGNOSTIC DATA REVIEWED: BMET    Component Value Date/Time   NA 136 06/22/2023 1101   K 4.4 06/22/2023 1101   CL 100 06/22/2023 1101   CO2 23 06/22/2023 1101   GLUCOSE 85 06/22/2023 1101   BUN 24 06/22/2023 1101   CREATININE 1.00 06/22/2023 1101   CALCIUM 9.2 06/22/2023 1101   Lab Results  Component Value Date   HGBA1C 5.2 06/22/2023   HGBA1C 5.6  08/11/2022   Lab Results  Component Value Date   INSULIN  31.4 (H) 06/22/2023   INSULIN  27.7 (H) 08/11/2022   Lab Results  Component Value Date   TSH 24.000 (H) 08/11/2022   CBC    Component Value Date/Time   WBC 4.7 08/11/2022 1021   RBC 3.79 08/11/2022 1021   HGB 11.9 08/11/2022 1021   HCT 34.2 08/11/2022 1021   PLT 199 08/11/2022 1021   MCV 90 08/11/2022 1021   MCH 31.4 08/11/2022 1021   MCHC 34.8 08/11/2022 1021   RDW 12.4 08/11/2022 1021   Iron Studies No results found for: "IRON", "TIBC", "FERRITIN", "IRONPCTSAT" Lipid Panel     Component Value Date/Time   CHOL 139 06/22/2023 1106   TRIG 146 06/22/2023 1106   HDL 26 (L) 06/22/2023 1106   CHOLHDL 5.3 (H) 06/22/2023 1106   LDLCALC 87 06/22/2023 1106   Hepatic Function Panel     Component Value Date/Time   PROT 8.4 06/22/2023 1101   ALBUMIN 4.7 06/22/2023 1101   AST 47 (H) 06/22/2023 1101   ALT 45 (H) 06/22/2023 1101   ALKPHOS 58 06/22/2023 1101   BILITOT 0.4 06/22/2023 1101      Component Value Date/Time   TSH 24.000 (H) 08/11/2022 1021   Nutritional Lab Results  Component Value Date   VD25OH 53.7 06/22/2023   VD25OH 46.3 12/13/2022   VD25OH 29.7 (L) 08/11/2022    Attestations:   I, Special Puri, acting as a Stage manager for Marceil Sensor, DO., have compiled all relevant documentation for today's office visit on behalf of Marceil Sensor, DO, while in the presence of [employer], DO.  I have spent 40 minutes in the care of the patient today.  32 minutes was spent in direct face to face counseling on concepts such as adaptive thermogenesis, low glycemic index fruits and veggies, exercise recommendations in regards to weight loss, and other extensive nutritional counseling regarding her nutritional plan. This was all in addition to the counseling done on diseases as documented above.   I have reviewed the above documentation for accuracy and completeness, and I agree with the above. Ebony Ortiz, D.O.  The 21st Century Cures Act was signed into law in 2016 which includes the topic of electronic health records.  This provides immediate access to information in MyChart.  This includes consultation notes, operative notes, office notes, lab results and pathology reports.  If you have any questions about what you read please let us  know at your next visit so we can  discuss your concerns and take corrective action if need be.  We are right here with you.

## 2023-08-28 ENCOUNTER — Telehealth (INDEPENDENT_AMBULATORY_CARE_PROVIDER_SITE_OTHER): Payer: Self-pay | Admitting: Family Medicine

## 2023-08-28 NOTE — Telephone Encounter (Signed)
 Good morning!  Patient says that we are requesting a n ultrasound order but she does not know what kind of ultrasound it is supposed to be so her PCP was calling for clarification.   Her PCP, Carolynne Citron, can be reached at [phone] her ext is [phone].   Thanks!

## 2023-08-28 NOTE — Telephone Encounter (Signed)
 U/S order ?   Pt seen by Dr Carolene Chute on 08/24/23

## 2023-08-29 ENCOUNTER — Telehealth (INDEPENDENT_AMBULATORY_CARE_PROVIDER_SITE_OTHER): Payer: Self-pay | Admitting: Family Medicine

## 2023-08-29 NOTE — Telephone Encounter (Signed)
 Closed for lunch, will return at 1:30

## 2023-08-29 NOTE — Telephone Encounter (Signed)
 Returned call to [facility] and she stated patient told them our office told her she needed an ultrasound. She not sure what kind of U/S and we needed to fax the order to them. I informed Carolynne Citron, our office did not order an U/S on this patient. She stated she would call the patient back to follow up.

## 2023-08-29 NOTE — Telephone Encounter (Signed)
 Did not see an order for U/S

## 2023-08-29 NOTE — Telephone Encounter (Signed)
 Takyra called back

## 2023-08-30 ENCOUNTER — Other Ambulatory Visit (HOSPITAL_COMMUNITY): Payer: Self-pay

## 2023-08-30 NOTE — Telephone Encounter (Signed)
 LMTCB

## 2023-08-30 NOTE — Telephone Encounter (Signed)
 Patient returned call and I informed patient about Dr. Mercy Stall response. Per Dr. Carolene Chute she mentioned to the patient she may want to have her PCP do an U/S on her liver because of the elevated ALT. However she could wait until some of the weight is lost to see if they may help with there ALT results and/or she may have a fatty. Patient verbalized understanding.

## 2023-08-31 ENCOUNTER — Ambulatory Visit
Admission: RE | Admit: 2023-08-31 | Discharge: 2023-08-31 | Disposition: A | Discharge status: Home / Self Care | Source: Ambulatory Visit | Attending: Certified Nurse Midwife | Admitting: Certified Nurse Midwife

## 2023-08-31 DIAGNOSIS — Z1231 Encounter for screening mammogram for malignant neoplasm of breast: Secondary | ICD-10-CM | POA: Diagnosis present

## 2023-09-11 ENCOUNTER — Ambulatory Visit (INDEPENDENT_AMBULATORY_CARE_PROVIDER_SITE_OTHER): Admitting: Family Medicine

## 2023-09-11 ENCOUNTER — Other Ambulatory Visit (HOSPITAL_COMMUNITY): Payer: Self-pay

## 2023-09-11 ENCOUNTER — Encounter (INDEPENDENT_AMBULATORY_CARE_PROVIDER_SITE_OTHER): Payer: Self-pay | Admitting: Family Medicine

## 2023-09-11 VITALS — BP 86/62 | HR 96 | Temp 98.1°F | Ht 63.0 in | Wt 251.0 lb

## 2023-09-11 DIAGNOSIS — K900 Celiac disease: Secondary | ICD-10-CM | POA: Diagnosis not present

## 2023-09-11 DIAGNOSIS — E559 Vitamin D deficiency, unspecified: Secondary | ICD-10-CM

## 2023-09-11 DIAGNOSIS — R632 Polyphagia: Secondary | ICD-10-CM | POA: Diagnosis not present

## 2023-09-11 DIAGNOSIS — Z6841 Body Mass Index (BMI) 40.0 and over, adult: Secondary | ICD-10-CM

## 2023-09-11 DIAGNOSIS — R7989 Other specified abnormal findings of blood chemistry: Secondary | ICD-10-CM | POA: Diagnosis not present

## 2023-09-11 DIAGNOSIS — E669 Obesity, unspecified: Secondary | ICD-10-CM

## 2023-09-11 MED ORDER — VITAMIN D (ERGOCALCIFEROL) 1.25 MG (50000 UNIT) PO CAPS
50000.0000 [IU] | ORAL_CAPSULE | ORAL | 0 refills | Status: DC
  Filled 2023-09-11: qty 12, 84d supply, fill #0

## 2023-09-11 MED ORDER — WEGOVY 2.4 MG/0.75ML ~~LOC~~ SOAJ
2.4000 mg | SUBCUTANEOUS | 0 refills | Status: DC
  Filled 2023-09-11: qty 6, 56d supply, fill #0
  Filled 2023-10-06: qty 3, 28d supply, fill #0
  Filled 2023-11-04: qty 3, 28d supply, fill #1

## 2023-09-11 NOTE — Progress Notes (Signed)
 Office: [phone]  /  Fax: [phone]  WEIGHT SUMMARY AND BIOMETRICS  Anthropometric Measurements Height: 5\' 3"  (1.6 m) Weight: 251 lb (113.9 kg) BMI (Calculated): 44.47 Weight at Last Visit: 250 lb Weight Lost Since Last Visit: 0 Weight Gained Since Last Visit: 1 lb Starting Weight: 266 lb Total Weight Loss (lbs): 15 lb (6.804 kg) Peak Weight: 275 lb   Body Composition  Body Fat %: 49.7 % Fat Mass (lbs): 124.8 lbs Muscle Mass (lbs): 120 lbs Total Body Water (lbs): 89.4 lbs Visceral Fat Rating : 17   Other Clinical Data RMR: 2275 Fasting: no Labs: no Today's Visit #: 15 Starting Date: 08/11/22    Chief Complaint: OBESITY    History of Present Illness Ebony Ortiz is a 53 year old female with obesity who presents for a follow-up on her obesity treatment plan.  She is adhering to her category four eating plan about fifty percent of the time and engages in physical activity by walking three to four miles for ninety minutes, four times a week. Despite these efforts, she has gained one pound in the last month. She is currently on Wegovy , 2.4 mg, and experiences occasional heartburn, which she associates with menopause rather than the medication.  She is on a prescription for vitamin D , 50,000 IU weekly, and maintains hydration by consuming 130 ounces of water daily. She exercises five to six days a week. She has a history of celiac disease and adheres to a strict diet, avoiding processed foods and gluten.  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 She brought protein shakes and bars, as well as [place] breast, but found it difficult to maintain her usual diet and exercise routine during these trips.  She has a history of elevated liver function tests and mentions confusion regarding the need for a liver ultrasound, due to a miscommunication between  her primary care and the current practice regarding the necessity and urgency of this test.      PHYSICAL EXAM:  Blood pressure (!) 86/62, pulse 96, temperature 98.1 F (36.7 C), height 5\' 3"  (1.6 m), weight 251 lb (113.9 kg), SpO2 97%. Body mass index is 44.46 kg/m.  DIAGNOSTIC DATA REVIEWED:  BMET    Component Value Date/Time   NA 136 06/22/2023 1101   K 4.4 06/22/2023 1101   CL 100 06/22/2023 1101   CO2 23 06/22/2023 1101   GLUCOSE 85 06/22/2023 1101   BUN 24 06/22/2023 1101   CREATININE 1.00 06/22/2023 1101   CALCIUM 9.2 06/22/2023 1101   Lab Results  Component Value Date   HGBA1C 5.2 06/22/2023   HGBA1C 5.6 08/11/2022   Lab Results  Component Value Date   INSULIN  31.4 (H) 06/22/2023   INSULIN  27.7 (H) 08/11/2022   Lab Results  Component Value Date   TSH 24.000 (H) 08/11/2022   CBC    Component Value Date/Time   WBC 4.7 08/11/2022 1021   RBC 3.79 08/11/2022 1021   HGB 11.9 08/11/2022 1021   HCT 34.2 08/11/2022 1021   PLT 199 08/11/2022 1021   MCV 90 08/11/2022 1021   MCH 31.4 08/11/2022 1021   MCHC 34.8 08/11/2022 1021   RDW 12.4 08/11/2022 1021   Iron Studies No results found for: "IRON", "TIBC", "FERRITIN", "IRONPCTSAT" Lipid Panel     Component Value Date/Time   CHOL 139 06/22/2023 1106   TRIG 146 06/22/2023 1106  HDL 26 (L) 06/22/2023 1106   CHOLHDL 5.3 (H) 06/22/2023 1106   LDLCALC 87 06/22/2023 1106   Hepatic Function Panel     Component Value Date/Time   PROT 8.4 06/22/2023 1101   ALBUMIN 4.7 06/22/2023 1101   AST 47 (H) 06/22/2023 1101   ALT 45 (H) 06/22/2023 1101   ALKPHOS 58 06/22/2023 1101   BILITOT 0.4 06/22/2023 1101      Component Value Date/Time   TSH 24.000 (H) 08/11/2022 1021   Nutritional Lab Results  Component Value Date   VD25OH 53.7 06/22/2023   VD25OH 46.3 12/13/2022   VD25OH 29.7 (L) 08/11/2022     Assessment and Plan Assessment & Plan Obesity She follows her category four eating plan 50% of the time  and walks 3-4 miles for 90 minutes four times a week. She gained one pound in the last month. She is on Wegovy  2.4 mg without significant side effects. Challenges in maintaining her diet while traveling with her husband, a truck driver, were discussed. She consumes 130-135 grams of protein daily, exceeding the recommended amount, and is advised to aim for 100 grams of protein from real food to enhance weight loss, as it requires more energy to digest. - Refill Wegovy  for obesity management. - Continue following either the category four plan or a 1500 calorie and 100 or more gram of protein eating plan with more real food versus supplements whenever possible.  Elevated liver function tests She has mild but persistently elevated liver function tests, suggestive of fatty liver. There is no immediate concern for liver failure. Ten percent of people with fatty liver may develop liver scarring, and ten percent of those may progress to liver failure. She is encouraged to follow up with her primary care provider to review her liver function and determine if additional workup is required. - Encourage appointment with primary care to review liver function tests and determine if additional workup is needed.  Vitamin D  deficiency She is on a prescription of vitamin D  50,000 IU weekly for her deficiency without side effects or issues. - Refill vitamin D  prescription for 90 days.  Celiac disease She adheres to a strict gluten-free diet, avoiding processed foods and preferring cooking from scratch to manage her condition.      She was informed of the importance of frequent follow up visits to maximize her success with intensive lifestyle modifications for her multiple health conditions.    Jasmine Mesi, MD

## 2023-09-18 ENCOUNTER — Ambulatory Visit (INDEPENDENT_AMBULATORY_CARE_PROVIDER_SITE_OTHER): Admitting: Family Medicine

## 2023-10-06 ENCOUNTER — Other Ambulatory Visit (HOSPITAL_COMMUNITY): Payer: Self-pay

## 2023-10-16 ENCOUNTER — Encounter (INDEPENDENT_AMBULATORY_CARE_PROVIDER_SITE_OTHER): Payer: Self-pay | Admitting: Family Medicine

## 2023-10-16 ENCOUNTER — Ambulatory Visit (INDEPENDENT_AMBULATORY_CARE_PROVIDER_SITE_OTHER): Admitting: Family Medicine

## 2023-10-16 VITALS — BP 109/70 | HR 86 | Temp 98.3°F | Ht 63.0 in | Wt 242.0 lb

## 2023-10-16 DIAGNOSIS — E669 Obesity, unspecified: Secondary | ICD-10-CM | POA: Diagnosis not present

## 2023-10-16 DIAGNOSIS — E66813 Obesity, class 3: Secondary | ICD-10-CM

## 2023-10-16 DIAGNOSIS — Z6841 Body Mass Index (BMI) 40.0 and over, adult: Secondary | ICD-10-CM | POA: Diagnosis not present

## 2023-10-16 DIAGNOSIS — E559 Vitamin D deficiency, unspecified: Secondary | ICD-10-CM | POA: Diagnosis not present

## 2023-10-16 NOTE — Progress Notes (Signed)
 Barnie DOROTHA Jenkins, D.O.  ABFM, ABOM Specializing in Clinical Bariatric Medicine  Office located at: [address] [place], [place]  [address]   Assessment and Plan:  No orders of the defined types were placed in this encounter.  There are no discontinued medications.   No orders of the defined types were placed in this encounter.   FOR THE DISEASE OF OBESITY: BMI 45.0-49.9, adult (HCC), Starting BMI 46.07 Obesity, starting BMI 47.83 Assessment & Plan: Since last office visit with Dr. Verdon on 09/11/23 patient's muscle mass has decreased by 1.2 lbs. Fat mass has decreased by 7.2 lbs. Total body water has decreased by 1 lb.  Counseling done on how various foods will affect these numbers and how to maximize success  Total lbs lost to date: 24 lbs  Total weight loss percentage to date: -9.02%    Recommended Dietary Goals Ebony Ortiz is currently in the action stage of change. As such, her goal is to continue weight management plan.  She has agreed to: Journal 1650-1750 calories and 110+ g of protein per day using CAT 4 MP as a guide.    Behavioral Intervention We discussed the following today: increasing lean protein intake to established goals, decreasing simple carbohydrates , avoiding skipping meals, increasing water intake , work on meal planning and preparation, work on tracking and journaling calories using tracking application, reading food labels , decreasing eating out or consumption of processed foods, and making healthy choices when eating convenient foods, practice mindfulness eating and understand the difference between hunger signals and cravings, avoiding temptations and identifying enticing environmental cues, and planning for success  We also discussed post exercise elevated BP. Reviewed how we can expect blood pressure to drop if she continues to lose weight. Patient not currently on any meds. Will continue monitoring.  Additional resources provided today:  None  Evidence-based interventions for health behavior change were utilized today including the discussion of self monitoring techniques, problem-solving barriers and SMART goal setting techniques.   Regarding patient's less desirable eating habits and patterns, we employed the technique of small changes.   Pt will specifically work on: avoid skipping meals for next visit.    Recommended Physical Activity Goals Ebony Ortiz has been advised to work up to 300-450 minutes of moderate intensity aerobic activity a week and strengthening exercises 2-3 times per week for cardiovascular health, weight loss maintenance and preservation of muscle mass.   She has agreed to: Continue current level of physical activity    Pharmacotherapy Pt is on Wegovy  2.4 mg once weekly. Tolerating well and denies any GI upset or other adverse SE. Continue with Wegovy  as prescribed.  We both agreed to: Continue with current nutritional and behavioral strategies   ASSOCIATED CONDITIONS ADDRESSED TODAY: Vitamin D  deficiency Assessment & Plan: Lab Results  Component Value Date   VD25OH 53.7 06/22/2023   VD25OH 46.3 12/13/2022   VD25OH 29.7 (L) 08/11/2022   Complaint with ERGO 50K units once weekly. Tolerating well with no SE. No adverse side effects reported today. Continue with current supplementation regimen as prescribed. Reach out periodically. No refill required today.   Follow up:   Return in about 4 weeks (around 11/13/2023) for 1 month f/u with Dr. Verdon and make another appt with Dr. MALVA if pt desires. . She was informed of the importance of frequent follow up visits to maximize her success with intensive lifestyle modifications for her multiple health conditions.  Subjective:   Chief complaint: Obesity Ebony Ortiz is here  to discuss her progress with her obesity treatment plan. She is on the Category 4 Plan and states she is following her eating plan approximately 75-80% of the time. She states she is walking  about 3 miles for 90 minutes 4 days per week.   Interval History:  Ebony Ortiz is here for a follow up office visit. Since last OV on 09/11/23 with Dr. Verdon, she is down 9 lbs. Hunger is well controlled. Reports a busy schedule with work as she is in the process of having a career change. She at times skips lunch or dinner. When skipping dinner, she states she does this to avoid eating too late. She journals her intake and reports an avg of 1700 calories and 130+ g of proteins.   Pharmacotherapy for weight loss: She is currently taking Wegovy  2.4 mg once weekly.   Review of Systems:  Pertinent positives were addressed with patient today.  Reviewed by clinician on day of visit: allergies, medications, problem list, medical history, surgical history, family history, social history, and previous encounter notes.  Weight Summary and Biometrics   Weight Lost Since Last Visit: 9lb  Weight Gained Since Last Visit: 0lb    Vitals Temp: 98.3 F (36.8 C) BP: 109/70 Pulse Rate: 86 SpO2: 98 %   Anthropometric Measurements Height: 5' 3 (1.6 m) Weight: 242 lb (109.8 kg) BMI (Calculated): 42.88 Weight at Last Visit: 251lb Weight Lost Since Last Visit: 9lb Weight Gained Since Last Visit: 0lb Starting Weight: 266lb Total Weight Loss (lbs): 24 lb (10.9 kg) Peak Weight: 275lb   Body Composition  Body Fat %: 48.4 % Fat Mass (lbs): 117.6 lbs Muscle Mass (lbs): 118.8 lbs Total Body Water (lbs): 88.4 lbs Visceral Fat Rating : 16   Other Clinical Data RMR: 2275 Fasting: No Labs: No Today's Visit #: 16 Starting Date: 08/11/22    Objective:   PHYSICAL EXAM: Blood pressure 109/70, pulse 86, temperature 98.3 F (36.8 C), height 5' 3 (1.6 m), weight 242 lb (109.8 kg), SpO2 98%. Body mass index is 42.87 kg/m.  General: she is overweight, cooperative and in no acute distress. PSYCH: Has normal mood, affect and thought process.   HEENT: EOMI, sclerae are anicteric. Lungs: Normal  breathing effort, no conversational dyspnea. Extremities: Moves * 4 Neurologic: A and O * 3, good insight  DIAGNOSTIC DATA REVIEWED: BMET    Component Value Date/Time   NA 136 06/22/2023 1101   K 4.4 06/22/2023 1101   CL 100 06/22/2023 1101   CO2 23 06/22/2023 1101   GLUCOSE 85 06/22/2023 1101   BUN 24 06/22/2023 1101   CREATININE 1.00 06/22/2023 1101   CALCIUM 9.2 06/22/2023 1101   Lab Results  Component Value Date   HGBA1C 5.2 06/22/2023   HGBA1C 5.6 08/11/2022   Lab Results  Component Value Date   INSULIN  31.4 (H) 06/22/2023   INSULIN  27.7 (H) 08/11/2022   Lab Results  Component Value Date   TSH 24.000 (H) 08/11/2022   CBC    Component Value Date/Time   WBC 4.7 08/11/2022 1021   RBC 3.79 08/11/2022 1021   HGB 11.9 08/11/2022 1021   HCT 34.2 08/11/2022 1021   PLT 199 08/11/2022 1021   MCV 90 08/11/2022 1021   MCH 31.4 08/11/2022 1021   MCHC 34.8 08/11/2022 1021   RDW 12.4 08/11/2022 1021   Iron Studies No results found for: IRON, TIBC, FERRITIN, IRONPCTSAT Lipid Panel     Component Value Date/Time   CHOL 139 06/22/2023 1106  TRIG 146 06/22/2023 1106   HDL 26 (L) 06/22/2023 1106   CHOLHDL 5.3 (H) 06/22/2023 1106   LDLCALC 87 06/22/2023 1106   Hepatic Function Panel     Component Value Date/Time   PROT 8.4 06/22/2023 1101   ALBUMIN 4.7 06/22/2023 1101   AST 47 (H) 06/22/2023 1101   ALT 45 (H) 06/22/2023 1101   ALKPHOS 58 06/22/2023 1101   BILITOT 0.4 06/22/2023 1101      Component Value Date/Time   TSH 24.000 (H) 08/11/2022 1021   Nutritional Lab Results  Component Value Date   VD25OH 53.7 06/22/2023   VD25OH 46.3 12/13/2022   VD25OH 29.7 (L) 08/11/2022    Attestations:   LILLETTE Vernell Forest, acting as a medical scribe for Barnie Jenkins, DO., have compiled all relevant documentation for today's office visit on behalf of Barnie Jenkins, DO, while in the presence of [employer], DO.  Reviewed by clinician on day of visit:  allergies, medications, problem list, medical history, surgical history, family history, social history, and previous encounter notes pertinent to patient's obesity diagnosis.  I have spent 30  minutes in the care of the patient today including: 21 minutes of face-to face counseling regarding patient's inconsistencies with her diet, strategies to get all of her calories in, ways to get more consistent with journaling and other weight loss strategies.   Additional time spent preparing to see patient (e.g. review and interpretation of tests, old notes ), obtaining and/or reviewing separately obtained history, performing a medically appropriate examination or evaluation, counseling and educating the patient, ordering medications, test or procedures, documenting clinical information in the electronic or other health care record, and independently interpreting results and communicating results to the patient, family, or caregiver   I have reviewed the above documentation for accuracy and completeness, and I agree with the above. Barnie JINNY Jenkins, D.O.  The 21st Century Cures Act was signed into law in 2016 which includes the topic of electronic health records.  This provides immediate access to information in MyChart.  This includes consultation notes, operative notes, office notes, lab results and pathology reports.  If you have any questions about what you read please let us  know at your next visit so we can discuss your concerns and take corrective action if need be.  We are right here with you.

## 2023-11-13 ENCOUNTER — Other Ambulatory Visit (HOSPITAL_COMMUNITY): Payer: Self-pay

## 2023-11-13 ENCOUNTER — Encounter (INDEPENDENT_AMBULATORY_CARE_PROVIDER_SITE_OTHER): Payer: Self-pay | Admitting: Family Medicine

## 2023-11-13 ENCOUNTER — Ambulatory Visit (INDEPENDENT_AMBULATORY_CARE_PROVIDER_SITE_OTHER): Admitting: Family Medicine

## 2023-11-13 VITALS — BP 114/77 | HR 87 | Temp 98.0°F | Ht 63.0 in | Wt 245.0 lb

## 2023-11-13 DIAGNOSIS — E860 Dehydration: Secondary | ICD-10-CM | POA: Diagnosis not present

## 2023-11-13 DIAGNOSIS — E559 Vitamin D deficiency, unspecified: Secondary | ICD-10-CM | POA: Diagnosis not present

## 2023-11-13 DIAGNOSIS — E669 Obesity, unspecified: Secondary | ICD-10-CM | POA: Diagnosis not present

## 2023-11-13 DIAGNOSIS — R632 Polyphagia: Secondary | ICD-10-CM

## 2023-11-13 DIAGNOSIS — E66813 Obesity, class 3: Secondary | ICD-10-CM

## 2023-11-13 DIAGNOSIS — Z6841 Body Mass Index (BMI) 40.0 and over, adult: Secondary | ICD-10-CM

## 2023-11-13 MED ORDER — WEGOVY 2.4 MG/0.75ML ~~LOC~~ SOAJ
2.4000 mg | SUBCUTANEOUS | 0 refills | Status: DC
  Filled 2023-11-13: qty 6, 56d supply, fill #0
  Filled 2023-12-01: qty 3, 28d supply, fill #0

## 2023-11-13 MED ORDER — VITAMIN D (ERGOCALCIFEROL) 1.25 MG (50000 UNIT) PO CAPS
50000.0000 [IU] | ORAL_CAPSULE | ORAL | 0 refills | Status: DC
Start: 2023-11-13 — End: 2023-12-11
  Filled 2023-11-13 – 2023-12-01 (×2): qty 12, 84d supply, fill #0

## 2023-11-13 NOTE — Progress Notes (Unsigned)
 Office: [phone]  /  Fax: [phone]  WEIGHT SUMMARY AND BIOMETRICS  Anthropometric Measurements Height: 5' 3 (1.6 m) Weight: 245 lb (111.1 kg) BMI (Calculated): 43.41 Weight at Last Visit: 242 lb Weight Lost Since Last Visit: 0 Weight Gained Since Last Visit: 3 lb Starting Weight: 270 lb Total Weight Loss (lbs): 25 lb (11.3 kg) Peak Weight: 275 lb   Body Composition  Body Fat %: 50.2 % Fat Mass (lbs): 123.4 lbs Muscle Mass (lbs): 116.2 lbs Total Body Water (lbs): 90.4 lbs Visceral Fat Rating : 16   Other Clinical Data RMR: 2275 Fasting: no Labs: no Today's Visit #: 17 Starting Date: 08/11/22    Chief Complaint: OBESITY    History of Present Illness Ebony Ortiz is a 53 year old female with obesity who presents for a follow-up on her obesity diagnosis.  She has been experiencing difficulties in managing her diet, resulting in a weight gain of three pounds over the past month. She has been following her prescribed category four plan, which involves consuming 1600 to 1700 calories with 110 or more grams of protein, only 40% of the time. Her husband's recent injury has required her to take on additional responsibilities at home, impacting her ability to maintain her dietary plan.  Her husband sustained a burn injury from a hot coffee spill on his foot, which has significantly impacted her ability to focus on her diet. She has been preoccupied with caring for him, including taking him to the hospital multiple times and managing his wound care at home. This situation has led to her eating only one meal a day and not drinking enough water.  She has a history of obesity and polyphagia and is currently taking Wegovy . Recent blood work indicated dehydration, which she attributes to not drinking enough water due to her busy schedule. She has been trying to increase her water intake, consuming approximately 180 ounces of water on Saturday.      PHYSICAL EXAM:  Blood  pressure 114/77, pulse 87, temperature 98 F (36.7 C), height 5' 3 (1.6 m), weight 245 lb (111.1 kg), SpO2 96%. Body mass index is 43.4 kg/m.  DIAGNOSTIC DATA REVIEWED:  BMET    Component Value Date/Time   NA 136 06/22/2023 1101   K 4.4 06/22/2023 1101   CL 100 06/22/2023 1101   CO2 23 06/22/2023 1101   GLUCOSE 85 06/22/2023 1101   BUN 24 06/22/2023 1101   CREATININE 1.00 06/22/2023 1101   CALCIUM 9.2 06/22/2023 1101   Lab Results  Component Value Date   HGBA1C 5.2 06/22/2023   HGBA1C 5.6 08/11/2022   Lab Results  Component Value Date   INSULIN  31.4 (H) 06/22/2023   INSULIN  27.7 (H) 08/11/2022   Lab Results  Component Value Date   TSH 24.000 (H) 08/11/2022   CBC    Component Value Date/Time   WBC 4.7 08/11/2022 1021   RBC 3.79 08/11/2022 1021   HGB 11.9 08/11/2022 1021   HCT 34.2 08/11/2022 1021   PLT 199 08/11/2022 1021   MCV 90 08/11/2022 1021   MCH 31.4 08/11/2022 1021   MCHC 34.8 08/11/2022 1021   RDW 12.4 08/11/2022 1021   Iron Studies No results found for: IRON, TIBC, FERRITIN, IRONPCTSAT Lipid Panel     Component Value Date/Time   CHOL 139 06/22/2023 1106   TRIG 146 06/22/2023 1106   HDL 26 (L) 06/22/2023 1106   CHOLHDL 5.3 (H) 06/22/2023 1106   LDLCALC 87 06/22/2023 1106   Hepatic  Function Panel     Component Value Date/Time   PROT 8.4 06/22/2023 1101   ALBUMIN 4.7 06/22/2023 1101   AST 47 (H) 06/22/2023 1101   ALT 45 (H) 06/22/2023 1101   ALKPHOS 58 06/22/2023 1101   BILITOT 0.4 06/22/2023 1101      Component Value Date/Time   TSH 24.000 (H) 08/11/2022 1021   Nutritional Lab Results  Component Value Date   VD25OH 53.7 06/22/2023   VD25OH 46.3 12/13/2022   VD25OH 29.7 (L) 08/11/2022     Assessment and Plan Assessment & Plan Obesity She is following a category four eating plan, consuming 1600-1700 calories with at least 110 grams of protein. Adherence is 40%, with a three-pound weight gain attributed to life stressors,  including her husband's injury. Some weight gain may be due to water retention. - Continue category four eating plan with 1600-1700 calories and at least 110 grams of protein. - Encourage adherence to diet and exercise regimen. - Refill Wegovy  prescription for 90 days.  Polyphagia She is treated with Wegovy  for polyphagia, with no reported side effects and good tolerance. - Refill Wegovy  prescription for 90 days.  Vitamin D  Deficiency - Refill vitamin D  prescription for 90 days.  Dehydration Recent blood work indicated dehydration. Blood pressure is optimal, and A1c is 4, indicating good glycemic control. - Increase water intake to address dehydration. - Contact primary care provider to obtain lab results and ensure they are shared with the current practice.    She was informed of the importance of frequent follow up visits to maximize her success with intensive lifestyle modifications for her multiple health conditions.    Louann Penton, MD

## 2023-11-14 ENCOUNTER — Other Ambulatory Visit: Payer: Self-pay

## 2023-11-23 ENCOUNTER — Other Ambulatory Visit (HOSPITAL_COMMUNITY): Payer: Self-pay

## 2023-11-28 ENCOUNTER — Other Ambulatory Visit (HOSPITAL_COMMUNITY): Payer: Self-pay

## 2023-12-01 ENCOUNTER — Other Ambulatory Visit: Payer: Self-pay

## 2023-12-11 ENCOUNTER — Encounter (INDEPENDENT_AMBULATORY_CARE_PROVIDER_SITE_OTHER): Payer: Self-pay | Admitting: Family Medicine

## 2023-12-11 ENCOUNTER — Other Ambulatory Visit (HOSPITAL_COMMUNITY): Payer: Self-pay

## 2023-12-11 ENCOUNTER — Ambulatory Visit (INDEPENDENT_AMBULATORY_CARE_PROVIDER_SITE_OTHER): Admitting: Family Medicine

## 2023-12-11 VITALS — BP 104/74 | HR 79 | Temp 98.4°F | Ht 63.0 in | Wt 241.0 lb

## 2023-12-11 DIAGNOSIS — Z566 Other physical and mental strain related to work: Secondary | ICD-10-CM

## 2023-12-11 DIAGNOSIS — Z6841 Body Mass Index (BMI) 40.0 and over, adult: Secondary | ICD-10-CM

## 2023-12-11 DIAGNOSIS — R252 Cramp and spasm: Secondary | ICD-10-CM | POA: Diagnosis not present

## 2023-12-11 DIAGNOSIS — E559 Vitamin D deficiency, unspecified: Secondary | ICD-10-CM

## 2023-12-11 DIAGNOSIS — R632 Polyphagia: Secondary | ICD-10-CM | POA: Diagnosis not present

## 2023-12-11 DIAGNOSIS — E66813 Obesity, class 3: Secondary | ICD-10-CM

## 2023-12-11 DIAGNOSIS — E669 Obesity, unspecified: Secondary | ICD-10-CM

## 2023-12-11 MED ORDER — WEGOVY 2.4 MG/0.75ML ~~LOC~~ SOAJ
2.4000 mg | SUBCUTANEOUS | 0 refills | Status: DC
  Filled 2023-12-11: qty 6, 56d supply, fill #0
  Filled 2023-12-29: qty 3, 28d supply, fill #0

## 2023-12-11 MED ORDER — VITAMIN D (ERGOCALCIFEROL) 1.25 MG (50000 UNIT) PO CAPS
50000.0000 [IU] | ORAL_CAPSULE | ORAL | 0 refills | Status: DC
  Filled 2023-12-11 – 2023-12-29 (×2): qty 12, 84d supply, fill #0

## 2023-12-11 NOTE — Progress Notes (Signed)
 Office: [phone]  /  Fax: [phone]  WEIGHT SUMMARY AND BIOMETRICS  Anthropometric Measurements Height: 5' 3 (1.6 m) Weight: 241 lb (109.3 kg) BMI (Calculated): 42.7 Weight at Last Visit: 245 Weight Lost Since Last Visit: 4 lb Weight Gained Since Last Visit: 0 Starting Weight: 270 lb Total Weight Loss (lbs): 29 lb (13.2 kg) Peak Weight: 275 lb   Body Composition  Body Fat %: 48.9 % Fat Mass (lbs): 118 lbs Muscle Mass (lbs): 117 lbs Total Body Water (lbs): 87.6 lbs Visceral Fat Rating : 16   Other Clinical Data RMR: 2275 Fasting: yes Labs: no Today's Visit #: 18 Starting Date: 08/11/22    Chief Complaint: OBESITY    History of Present Illness Ebony Ortiz is a 53 year old female who presents for a follow-up on her weight management and dietary plan.  She has been following a category four eating plan, focusing on whole foods, meeting protein goals, hydrating well, and not skipping meals. She has lost four pounds in the last month since her last visit.  She recently started a new job at [employer], which is more physically demanding than anticipated. Her role involves significant physical activity, including moving heavy clothing racks and climbing ladders, resulting in her taking approximately 18,000 steps per day without pushing racks, and up to 26,000 steps with them. This increased activity has led to muscle soreness and fatigue, with her describing her arms as 'like jelly' and experiencing pain in her core and other parts of her body.  She has increased her caloric intake to 1,875 calories per day to match her increased physical activity. She consumes about 145 to 150 ounces of water daily and has increased her protein intake to 140-145 grams per day. Despite these adjustments, she experiences muscle cramps, particularly at night.  She reports significant workplace stress due to the demanding nature of her job and the lack of adequate support and resources,  such as keys and a functioning work phone. She describes the work environment as chaotic and disorganized, contributing to her stress levels.  She is currently on Wegovy  for weight management and vitamin D  supplementation, with her last vitamin D  level being 53 in February. She is not interested in taking additional medications for stress management.      PHYSICAL EXAM:  Blood pressure 104/74, pulse 79, temperature 98.4 F (36.9 C), height 5' 3 (1.6 m), weight 241 lb (109.3 kg), SpO2 98%. Body mass index is 42.69 kg/m.  DIAGNOSTIC DATA REVIEWED:  BMET    Component Value Date/Time   NA 136 06/22/2023 1101   K 4.4 06/22/2023 1101   CL 100 06/22/2023 1101   CO2 23 06/22/2023 1101   GLUCOSE 85 06/22/2023 1101   BUN 24 06/22/2023 1101   CREATININE 1.00 06/22/2023 1101   CALCIUM 9.2 06/22/2023 1101   Lab Results  Component Value Date   HGBA1C 5.2 06/22/2023   HGBA1C 5.6 08/11/2022   Lab Results  Component Value Date   INSULIN  31.4 (H) 06/22/2023   INSULIN  27.7 (H) 08/11/2022   Lab Results  Component Value Date   TSH 24.000 (H) 08/11/2022   CBC    Component Value Date/Time   WBC 4.7 08/11/2022 1021   RBC 3.79 08/11/2022 1021   HGB 11.9 08/11/2022 1021   HCT 34.2 08/11/2022 1021   PLT 199 08/11/2022 1021   MCV 90 08/11/2022 1021   MCH 31.4 08/11/2022 1021   MCHC 34.8 08/11/2022 1021   RDW 12.4 08/11/2022 1021  Iron Studies No results found for: IRON, TIBC, FERRITIN, IRONPCTSAT Lipid Panel     Component Value Date/Time   CHOL 139 06/22/2023 1106   TRIG 146 06/22/2023 1106   HDL 26 (L) 06/22/2023 1106   CHOLHDL 5.3 (H) 06/22/2023 1106   LDLCALC 87 06/22/2023 1106   Hepatic Function Panel     Component Value Date/Time   PROT 8.4 06/22/2023 1101   ALBUMIN 4.7 06/22/2023 1101   AST 47 (H) 06/22/2023 1101   ALT 45 (H) 06/22/2023 1101   ALKPHOS 58 06/22/2023 1101   BILITOT 0.4 06/22/2023 1101      Component Value Date/Time   TSH 24.000 (H)  08/11/2022 1021   Nutritional Lab Results  Component Value Date   VD25OH 53.7 06/22/2023   VD25OH 46.3 12/13/2022   VD25OH 29.7 (L) 08/11/2022     Assessment and Plan Assessment & Plan Obesity and Polyphagia She has been following a category four eating plan and increased her physical activity significantly due to her physically demanding new job. She has lost four pounds in the last month, with a total loss of five pounds of fat and a gain of one pound of muscle. She is experiencing increased hunger due to her increased activity level and has adjusted her caloric intake to 1875-2000 calories per day, focusing on protein intake of over 120 grams per day. This adjustment is supported given her increased physical activity and metabolic rate. - Continue current eating plan and physical activity. - Maintain caloric intake between 1800-2000 calories per day. - Ensure protein intake is over 120 grams per day. - Follow up in four weeks.  Muscle Cramps She is experiencing leg muscle cramps, particularly at night, likely due to electrolyte imbalance from increased sweating and physical activity. She is losing electrolytes through sweat and not adequately replacing them, leading to cramps. - Substitute some water intake with an electrolyte-containing beverage like Powerade Zero or Crystal Light with electrolytes once a day.  Vitamin D  Deficiency Her last vitamin D  level was 53 in February, which was considered adequate. Plans to recheck her vitamin D  levels soon to ensure they are not over-replaced. - Refill vitamin D  for 90 days. - Recheck vitamin D  levels in 1-2 months.  Workplace Stress She is experiencing significant stress due to her new job, which is physically demanding and has a challenging work environment. She is managing the stress by avoiding interactions with a particularly difficult coworker and focusing on her tasks. She is not interested in medication for stress management at this  time. - Encourage stress management strategies.  Follow-up She has a follow-up appointment scheduled, and plans to recheck her vitamin D  levels and other labs at that time. - Schedule fasting lab work for the next visit in October. - Adjust appointment time if necessary to accommodate fasting.    She was informed of the importance of frequent follow up visits to maximize her success with intensive lifestyle modifications for her multiple health conditions.    Louann Penton, MD

## 2023-12-20 ENCOUNTER — Other Ambulatory Visit (HOSPITAL_COMMUNITY): Payer: Self-pay

## 2023-12-29 ENCOUNTER — Other Ambulatory Visit (HOSPITAL_COMMUNITY): Payer: Self-pay

## 2023-12-29 ENCOUNTER — Other Ambulatory Visit: Payer: Self-pay

## 2024-01-03 ENCOUNTER — Other Ambulatory Visit (HOSPITAL_COMMUNITY): Payer: Self-pay

## 2024-01-09 ENCOUNTER — Encounter (INDEPENDENT_AMBULATORY_CARE_PROVIDER_SITE_OTHER): Payer: Self-pay | Admitting: Family Medicine

## 2024-01-09 ENCOUNTER — Other Ambulatory Visit (HOSPITAL_COMMUNITY): Payer: Self-pay

## 2024-01-09 ENCOUNTER — Ambulatory Visit (INDEPENDENT_AMBULATORY_CARE_PROVIDER_SITE_OTHER): Admitting: Family Medicine

## 2024-01-09 VITALS — BP 103/70 | HR 72 | Temp 98.0°F | Ht 63.0 in | Wt 240.0 lb

## 2024-01-09 DIAGNOSIS — Z6841 Body Mass Index (BMI) 40.0 and over, adult: Secondary | ICD-10-CM

## 2024-01-09 DIAGNOSIS — R632 Polyphagia: Secondary | ICD-10-CM

## 2024-01-09 DIAGNOSIS — E88819 Insulin resistance, unspecified: Secondary | ICD-10-CM

## 2024-01-09 DIAGNOSIS — K760 Fatty (change of) liver, not elsewhere classified: Secondary | ICD-10-CM | POA: Diagnosis not present

## 2024-01-09 DIAGNOSIS — E559 Vitamin D deficiency, unspecified: Secondary | ICD-10-CM

## 2024-01-09 DIAGNOSIS — E66813 Obesity, class 3: Secondary | ICD-10-CM

## 2024-01-09 MED ORDER — VITAMIN D (ERGOCALCIFEROL) 1.25 MG (50000 UNIT) PO CAPS
50000.0000 [IU] | ORAL_CAPSULE | ORAL | 0 refills | Status: AC
  Filled 2024-01-09 – 2024-02-01 (×5): qty 12, 84d supply, fill #0

## 2024-01-09 MED ORDER — WEGOVY 2.4 MG/0.75ML ~~LOC~~ SOAJ
2.4000 mg | SUBCUTANEOUS | 0 refills | Status: DC
  Filled 2024-01-09 – 2024-01-25 (×2): qty 6, 56d supply, fill #0
  Filled 2024-01-25 – 2024-01-26 (×4): qty 3, 28d supply, fill #0
  Filled 2024-01-26 – 2024-01-30 (×3): qty 6, 56d supply, fill #0
  Filled 2024-01-30: qty 3, 28d supply, fill #0
  Filled 2024-02-01: qty 6, 56d supply, fill #0

## 2024-01-09 NOTE — Progress Notes (Signed)
 Office: [phone]  /  Fax: [phone]  WEIGHT SUMMARY AND BIOMETRICS  Anthropometric Measurements Height: 5' 3 (1.6 m) Weight: 240 lb (108.9 kg) BMI (Calculated): 42.52 Weight at Last Visit: 241 lb Weight Lost Since Last Visit: 1 lb Weight Gained Since Last Visit: 0 Starting Weight: 270 lb Total Weight Loss (lbs): 30 lb (13.6 kg) Peak Weight: 275 lb   Body Composition  Body Fat %: 49.3 % Fat Mass (lbs): 118.6 lbs Muscle Mass (lbs): 115.8 lbs Total Body Water (lbs): 90.4 lbs Visceral Fat Rating : 16   Other Clinical Data RMR: 2275 Fasting: no Labs: no Today's Visit #: 19 Starting Date: 08/11/22    Chief Complaint: OBESITY    History of Present Illness Ebony Ortiz is a 53 year old female with obesity and insulin  resistance who presents for obesity treatment and management of related health issues.  She is currently on Wegovy  at a dose of 2.4 mg and requests a refill. She adheres to a category four eating plan 95% of the time and walks approximately 17,000 steps daily. Despite these efforts, she has only lost one pound in the last month. She is concerned about possible fluid retention.  She is actively managing her insulin  resistance through diet, exercise, and weight loss. She is committed to lifestyle modifications to improve her condition.  She has a history of MASLD with elevated liver function tests, which she is managing with diet, exercise, and weight loss.  She has a vitamin D  deficiency and is on ergocalciferol , 50,000 IU per week.  No issues with her current medication regimen, including Wegovy . She uses the [facility] for her prescriptions.      PHYSICAL EXAM:  Blood pressure 103/70, pulse 72, temperature 98 F (36.7 C), height 5' 3 (1.6 m), weight 240 lb (108.9 kg), SpO2 97%. Body mass index is 42.51 kg/m.  DIAGNOSTIC DATA REVIEWED:  BMET    Component Value Date/Time   NA 136 06/22/2023 1101   K 4.4 06/22/2023 1101    CL 100 06/22/2023 1101   CO2 23 06/22/2023 1101   GLUCOSE 85 06/22/2023 1101   BUN 24 06/22/2023 1101   CREATININE 1.00 06/22/2023 1101   CALCIUM 9.2 06/22/2023 1101   Lab Results  Component Value Date   HGBA1C 5.2 06/22/2023   HGBA1C 5.6 08/11/2022   Lab Results  Component Value Date   INSULIN  31.4 (H) 06/22/2023   INSULIN  27.7 (H) 08/11/2022   Lab Results  Component Value Date   TSH 24.000 (H) 08/11/2022   CBC    Component Value Date/Time   WBC 4.7 08/11/2022 1021   RBC 3.79 08/11/2022 1021   HGB 11.9 08/11/2022 1021   HCT 34.2 08/11/2022 1021   PLT 199 08/11/2022 1021   MCV 90 08/11/2022 1021   MCH 31.4 08/11/2022 1021   MCHC 34.8 08/11/2022 1021   RDW 12.4 08/11/2022 1021   Iron Studies No results found for: IRON, TIBC, FERRITIN, IRONPCTSAT Lipid Panel     Component Value Date/Time   CHOL 139 06/22/2023 1106   TRIG 146 06/22/2023 1106   HDL 26 (L) 06/22/2023 1106   CHOLHDL 5.3 (H) 06/22/2023 1106   LDLCALC 87 06/22/2023 1106   Hepatic Function Panel     Component Value Date/Time   PROT 8.4 06/22/2023 1101   ALBUMIN 4.7 06/22/2023 1101   AST 47 (H) 06/22/2023 1101   ALT 45 (H) 06/22/2023 1101   ALKPHOS 58 06/22/2023 1101   BILITOT 0.4 06/22/2023 1101  Component Value Date/Time   TSH 24.000 (H) 08/11/2022 1021   Nutritional Lab Results  Component Value Date   VD25OH 53.7 06/22/2023   VD25OH 46.3 12/13/2022   VD25OH 29.7 (L) 08/11/2022     Assessment and Plan Assessment & Plan Class 3 Obesity with Polyphagia Class 3 obesity with ongoing weight management efforts. She follows a category four eating plan 95% of the time and walks approximately 17,000 steps daily. She has lost one pound in the last month, with a noted increase in water weight by three pounds and a slight decrease in muscle mass, common during weight loss. Protein intake is adequate, but accurate portion sizes are advised. - Refill Wegovy  2.4 mg - Encourage continued  adherence to category four eating plan - Recommend resistance exercises such as weighted vests, ankle weights, stair walking, and chair yoga - Provide recipes for high-protein meals  Insulin  Resistance Insulin  resistance managed with diet, exercise, and weight loss and Wegovy  - Continue Wegovy  - Continue diet, exercise and weight loss as discussed today as an important part of the treatment plan  Vitamin D  Deficiency Vitamin D  deficiency managed with ergocalciferol  50,000 IU weekly. - Refill ergocalciferol  50,000 IU weekly  Metabolic Dysfunction-Associated Steatotic Liver Disease (MASLD) with Elevated Liver Function Tests MASLD with elevated liver function tests managed with lifestyle modifications including diet and exercise. - Order lab tests to monitor liver function - Continue diet, exercise and weight loss as discussed today as an important part of the treatment plan       Ebony Ortiz was informed of the importance of frequent follow up visits to maximize her success with intensive lifestyle modifications for her obesity and obesity related health conditions as recommended by USPSTF and CMS guidelines   Louann Penton, MD

## 2024-01-12 LAB — LIPID PANEL WITH LDL/HDL RATIO
Cholesterol, Total: 125 mg/dL (ref 100–199)
HDL: 29 mg/dL — ABNORMAL LOW (ref >39)
LDL Chol Calc (NIH): 75 mg/dL (ref 0–99)
LDL/HDL Ratio: 2.6 ratio (ref 0.0–3.2)
Triglycerides: 111 mg/dL (ref 0–149)
VLDL Cholesterol Cal: 21 mg/dL (ref 5–40)

## 2024-01-12 LAB — INSULIN, RANDOM: INSULIN: 21.2 u[IU]/mL (ref 2.6–24.9)

## 2024-01-12 LAB — CMP14+EGFR
ALT: 40 IU/L — ABNORMAL HIGH (ref 0–32)
AST: 55 IU/L — ABNORMAL HIGH (ref 0–40)
Albumin: 4.6 g/dL (ref 3.8–4.9)
Alkaline Phosphatase: 53 IU/L (ref 44–121)
BUN/Creatinine Ratio: 24 — ABNORMAL HIGH (ref 9–23)
BUN: 25 mg/dL — ABNORMAL HIGH (ref 6–24)
Bilirubin Total: 0.5 mg/dL (ref 0.0–1.2)
CO2: 19 mmol/L — ABNORMAL LOW (ref 20–29)
Calcium: 9.5 mg/dL (ref 8.7–10.2)
Chloride: 102 mmol/L (ref 96–106)
Creatinine, Ser: 1.04 mg/dL — ABNORMAL HIGH (ref 0.57–1.00)
Globulin, Total: 3.7 g/dL (ref 1.5–4.5)
Glucose: 87 mg/dL (ref 70–99)
Potassium: 4.4 mmol/L (ref 3.5–5.2)
Sodium: 137 mmol/L (ref 134–144)
Total Protein: 8.3 g/dL (ref 6.0–8.5)
eGFR: 64 mL/min/1.73 (ref >59)

## 2024-01-12 LAB — HEMOGLOBIN A1C
Est. average glucose Bld gHb Est-mCnc: 97 mg/dL
Hgb A1c MFr Bld: 5 % (ref 4.8–5.6)

## 2024-01-12 LAB — VITAMIN D 25 HYDROXY (VIT D DEFICIENCY, FRACTURES): Vit D, 25-Hydroxy: 53.7 ng/mL (ref 30.0–100.0)

## 2024-01-12 LAB — VITAMIN B12: Vitamin B-12: 681 pg/mL (ref 232–1245)

## 2024-01-25 ENCOUNTER — Encounter (INDEPENDENT_AMBULATORY_CARE_PROVIDER_SITE_OTHER): Payer: Self-pay | Admitting: Family Medicine

## 2024-01-25 ENCOUNTER — Other Ambulatory Visit (HOSPITAL_COMMUNITY): Payer: Self-pay

## 2024-01-25 NOTE — Telephone Encounter (Signed)
**Note De-identified  [place] Obfuscation** Please advise 

## 2024-01-26 ENCOUNTER — Other Ambulatory Visit (HOSPITAL_COMMUNITY): Payer: Self-pay

## 2024-01-26 ENCOUNTER — Other Ambulatory Visit: Payer: Self-pay

## 2024-01-29 ENCOUNTER — Telehealth (INDEPENDENT_AMBULATORY_CARE_PROVIDER_SITE_OTHER): Payer: Self-pay | Admitting: Family Medicine

## 2024-01-29 ENCOUNTER — Other Ambulatory Visit (HOSPITAL_COMMUNITY): Payer: Self-pay

## 2024-01-29 NOTE — Telephone Encounter (Signed)
 Good morning!  She needs a PA for a 90-day prescription for wegovy  to be filled. She said there has been confusion at the pharmacy regarding the number of doses. She said the pharmacy has reached out to us  to request the PA.   Her pharmacy is the [facility].   Thanks!

## 2024-01-30 ENCOUNTER — Telehealth (INDEPENDENT_AMBULATORY_CARE_PROVIDER_SITE_OTHER): Payer: Self-pay

## 2024-01-30 ENCOUNTER — Other Ambulatory Visit (HOSPITAL_COMMUNITY): Payer: Self-pay

## 2024-01-30 NOTE — Telephone Encounter (Signed)
 Prior authorization has been submitted for Wegovy , 2.4 mg for obesity.  Awaiting determination.

## 2024-01-31 ENCOUNTER — Encounter (INDEPENDENT_AMBULATORY_CARE_PROVIDER_SITE_OTHER): Payer: Self-pay

## 2024-01-31 DIAGNOSIS — H1030 Unspecified acute conjunctivitis, unspecified eye: Secondary | ICD-10-CM | POA: Insufficient documentation

## 2024-01-31 DIAGNOSIS — J302 Other seasonal allergic rhinitis: Secondary | ICD-10-CM | POA: Insufficient documentation

## 2024-01-31 DIAGNOSIS — Z91013 Allergy to seafood: Secondary | ICD-10-CM | POA: Insufficient documentation

## 2024-01-31 DIAGNOSIS — D509 Iron deficiency anemia, unspecified: Secondary | ICD-10-CM | POA: Insufficient documentation

## 2024-01-31 NOTE — Telephone Encounter (Signed)
 Prior auth for Wegovy  has been denied.  Patient notified via my chart.

## 2024-02-01 ENCOUNTER — Encounter (HOSPITAL_COMMUNITY): Payer: Self-pay

## 2024-02-01 ENCOUNTER — Telehealth (INDEPENDENT_AMBULATORY_CARE_PROVIDER_SITE_OTHER): Payer: Self-pay

## 2024-02-01 ENCOUNTER — Ambulatory Visit (INDEPENDENT_AMBULATORY_CARE_PROVIDER_SITE_OTHER): Admitting: Family Medicine

## 2024-02-01 ENCOUNTER — Other Ambulatory Visit (HOSPITAL_COMMUNITY): Payer: Self-pay

## 2024-02-01 ENCOUNTER — Other Ambulatory Visit: Payer: Self-pay

## 2024-02-01 ENCOUNTER — Other Ambulatory Visit (INDEPENDENT_AMBULATORY_CARE_PROVIDER_SITE_OTHER): Payer: Self-pay | Admitting: Family Medicine

## 2024-02-01 ENCOUNTER — Encounter (INDEPENDENT_AMBULATORY_CARE_PROVIDER_SITE_OTHER): Payer: Self-pay | Admitting: Family Medicine

## 2024-02-01 VITALS — BP 94/67 | HR 87 | Temp 97.8°F | Ht 63.0 in | Wt 236.0 lb

## 2024-02-01 DIAGNOSIS — K760 Fatty (change of) liver, not elsewhere classified: Secondary | ICD-10-CM

## 2024-02-01 DIAGNOSIS — R632 Polyphagia: Secondary | ICD-10-CM | POA: Diagnosis not present

## 2024-02-01 DIAGNOSIS — Z6841 Body Mass Index (BMI) 40.0 and over, adult: Secondary | ICD-10-CM

## 2024-02-01 DIAGNOSIS — K900 Celiac disease: Secondary | ICD-10-CM | POA: Diagnosis not present

## 2024-02-01 DIAGNOSIS — E559 Vitamin D deficiency, unspecified: Secondary | ICD-10-CM

## 2024-02-01 DIAGNOSIS — E66813 Obesity, class 3: Secondary | ICD-10-CM | POA: Diagnosis not present

## 2024-02-01 DIAGNOSIS — R7989 Other specified abnormal findings of blood chemistry: Secondary | ICD-10-CM

## 2024-02-01 NOTE — Telephone Encounter (Signed)
 Prior authorization submitted for fatty liver for Wegovy .  Awaiting determination.

## 2024-02-01 NOTE — Addendum Note (Signed)
 Addended by: CHESLEY NIELS CROME on: 02/01/2024 01:48 PM   Modules accepted: Orders

## 2024-02-01 NOTE — Progress Notes (Signed)
 Office: [phone]  /  Fax: [phone]  WEIGHT SUMMARY AND BIOMETRICS  Anthropometric Measurements Height: 5' 3 (1.6 m) Weight: 236 lb (107 kg) BMI (Calculated): 41.82 Weight at Last Visit: 240 lb Weight Lost Since Last Visit: 4 lb Weight Gained Since Last Visit: 0 Starting Weight: 270 lb Total Weight Loss (lbs): 34 lb (15.4 kg) Peak Weight: 275 lb   Body Composition  Body Fat %: 47.3 % Fat Mass (lbs): 112 lbs Muscle Mass (lbs): 118.4 lbs Total Body Water (lbs): 84.8 lbs Visceral Fat Rating : 15   Other Clinical Data RMR: 2275 Fasting: no Labs: no Today's Visit #: 20 Starting Date: 08/11/22    Chief Complaint: OBESITY     History of Present Illness Ebony Ortiz is a 53 year old female with obesity and fatty liver who presents for obesity treatment.  She has been previously treated with Lexiva for obesity and polyphagia, but her insurance no longer covers it for this indication. She follows a category four eating plan with 100% adherence and has lost an additional four pounds over the past month, totaling a weight loss of 34 pounds. She engages in significant physical activity, including 25,000 steps daily and heavy lifting at her job.  She is concerned about her elevated liver enzymes and has been told she has fatty liver. Her AST and ALT have been elevated for a while, but she has not undergone imaging studies to further evaluate the liver.  She experienced a severe episode of diarrhea after consuming gluten, attributed to a celiac flare-up, which occurred a week prior to her blood work and resulted in dehydration. She uses Liquid IV for hydration. The diarrhea lasted several days and was severe enough to cause an accident in bed.  Her cholesterol levels have improved, with HDL increasing, triglycerides decreasing from 185 to 111, and LDL decreasing from 90 to 75. Her vitamin D  and B12 levels are stable, and her glucose and hemoglobin A1c have improved, with A1c  decreasing from 5.6 to 5.0. Her insulin  levels have also improved, decreasing from 52 to 21.  She has a history of celiac disease and thyroid  issues, which she believes may impact her weight management. She reports significant weight loss during her pregnancies, losing over 35 pounds with each, despite minimal weight gain during the pregnancies. Her children were born prematurely but were healthy.  She maintains a high level of physical activity, even on days off, and follows an 1800 calorie diet. She feels ravenous due to her physical activity and consumes 120-145 grams of protein daily. Her muscle mass has increased, and she is working on maintaining adequate hydration.      PHYSICAL EXAM:  Blood pressure 94/67, pulse 87, temperature 97.8 F (36.6 C), height 5' 3 (1.6 m), weight 236 lb (107 kg), SpO2 97%. Body mass index is 41.81 kg/m.  DIAGNOSTIC DATA REVIEWED:  BMET    Component Value Date/Time   NA 137 01/11/2024 1036   K 4.4 01/11/2024 1036   CL 102 01/11/2024 1036   CO2 19 (L) 01/11/2024 1036   GLUCOSE 87 01/11/2024 1036   BUN 25 (H) 01/11/2024 1036   CREATININE 1.04 (H) 01/11/2024 1036   CALCIUM 9.5 01/11/2024 1036   Lab Results  Component Value Date   HGBA1C 5.0 01/11/2024   HGBA1C 5.6 08/11/2022   Lab Results  Component Value Date   INSULIN  21.2 01/11/2024   INSULIN  27.7 (H) 08/11/2022   Lab Results  Component Value Date   TSH 24.000 (  H) 08/11/2022   CBC    Component Value Date/Time   WBC 4.7 08/11/2022 1021   RBC 3.79 08/11/2022 1021   HGB 11.9 08/11/2022 1021   HCT 34.2 08/11/2022 1021   PLT 199 08/11/2022 1021   MCV 90 08/11/2022 1021   MCH 31.4 08/11/2022 1021   MCHC 34.8 08/11/2022 1021   RDW 12.4 08/11/2022 1021   Iron Studies No results found for: IRON, TIBC, FERRITIN, IRONPCTSAT Lipid Panel     Component Value Date/Time   CHOL 125 01/11/2024 1036   TRIG 111 01/11/2024 1036   HDL 29 (L) 01/11/2024 1036   CHOLHDL 5.3 (H)  06/22/2023 1106   LDLCALC 75 01/11/2024 1036   Hepatic Function Panel     Component Value Date/Time   PROT 8.3 01/11/2024 1036   ALBUMIN 4.6 01/11/2024 1036   AST 55 (H) 01/11/2024 1036   ALT 40 (H) 01/11/2024 1036   ALKPHOS 53 01/11/2024 1036   BILITOT 0.5 01/11/2024 1036      Component Value Date/Time   TSH 24.000 (H) 08/11/2022 1021   Nutritional Lab Results  Component Value Date   VD25OH 53.7 01/11/2024   VD25OH 53.7 06/22/2023   VD25OH 46.3 12/13/2022     Assessment and Plan Assessment & Plan Class 3 Obesity with Polyphagia Following a category four eating plan, resulting in a total weight loss of 34 pounds. Engaging in 25,000 steps daily and heavy lifting at work. Insurance no longer covers Lexiva for obesity, but potential coverage for fatty liver is being explored. Concerns about abrupt cessation of Wegovy  are mitigated by its self-tapering nature over four weeks. Emphasis on maintaining weight loss through learned dietary habits even without medication. - Start prior authorization for Wegovy  for fatty liver. - Send prescription to [facility] pharmacy for assistance with insurance coverage. - Continue current dietary and exercise regimen. - Consider increasing caloric intake to 1900 calories if experiencing significant hunger. - Ensure protein intake of at least 120 grams per day.  Nonalcoholic Fatty Liver Disease Elevated liver enzymes consistent with fatty liver disease. Liver enzyme pattern suggests fatty liver, but further evaluation is necessary to exclude other causes. Weight loss is the primary treatment for fatty liver disease. - Order abdominal ultrasound to evaluate liver. - Order additional labs, including a hepatitis panel, to rule out other liver conditions.  Celiac Disease Experienced severe diarrhea after gluten exposure, leading to dehydration and abnormal lab results. Likely triggered by salad dressing containing gluten.  - Continue  gluten-free diet. - Monitor hydration status and kidney function.  Follow-Up Follow-up appointment scheduled in November to review results of abdominal ultrasound and additional labs. - Attend follow-up appointment in November. - Review results of abdominal ultrasound and additional labs at next visit.     I personally spent a total of 40 minutes in the care of the patient today including performing a medically appropriate evaluation of current problems, placing orders in the EMR, documenting clinical information in the EMR, customized nutritional counseling for their specific health and social needs, discussing results with the patient and educating them on how these results can affect their health and weight, and explaining the pathophysiology of obesity and how it is significantly more complex than eat less and exercise more.    Solymar was informed of the importance of frequent follow up visits to maximize her success with intensive lifestyle modifications for her obesity and obesity related health conditions as recommended by USPSTF and CMS guidelines   [employer],  MD

## 2024-02-01 NOTE — Addendum Note (Signed)
 Addended by: LAFE Katzenstein CROME on: 02/01/2024 03:59 PM   Modules accepted: Orders

## 2024-02-02 LAB — ACUTE VIRAL HEPATITIS (HAV, HBV, HCV)
HCV Ab: NONREACTIVE
Hep A IgM: NEGATIVE
Hep B C IgM: NEGATIVE
Hepatitis B Surface Ag: NEGATIVE

## 2024-02-02 LAB — HCV INTERPRETATION

## 2024-02-05 NOTE — Telephone Encounter (Signed)
 Wegovy  has been denied.  Patient notified via my chart.

## 2024-02-06 ENCOUNTER — Ambulatory Visit (INDEPENDENT_AMBULATORY_CARE_PROVIDER_SITE_OTHER): Admitting: Family Medicine

## 2024-03-06 ENCOUNTER — Encounter (INDEPENDENT_AMBULATORY_CARE_PROVIDER_SITE_OTHER): Payer: Self-pay | Admitting: Family Medicine

## 2024-03-06 ENCOUNTER — Ambulatory Visit (INDEPENDENT_AMBULATORY_CARE_PROVIDER_SITE_OTHER): Payer: Self-pay | Admitting: Family Medicine

## 2024-03-06 ENCOUNTER — Other Ambulatory Visit (HOSPITAL_COMMUNITY): Payer: Self-pay

## 2024-03-06 VITALS — BP 106/69 | HR 87 | Temp 98.1°F | Ht 63.0 in | Wt 245.0 lb

## 2024-03-06 DIAGNOSIS — Z6841 Body Mass Index (BMI) 40.0 and over, adult: Secondary | ICD-10-CM

## 2024-03-06 DIAGNOSIS — F439 Reaction to severe stress, unspecified: Secondary | ICD-10-CM

## 2024-03-06 DIAGNOSIS — R5383 Other fatigue: Secondary | ICD-10-CM | POA: Diagnosis not present

## 2024-03-06 MED ORDER — METFORMIN HCL 500 MG PO TABS
500.0000 mg | ORAL_TABLET | Freq: Every day | ORAL | 0 refills | Status: AC
  Filled 2024-03-06: qty 30, 30d supply, fill #0

## 2024-03-06 NOTE — Progress Notes (Signed)
 Office: [phone]  /  Fax: [phone]  WEIGHT SUMMARY AND BIOMETRICS  Anthropometric Measurements Height: 5' 3 (1.6 m) Weight: 245 lb (111.1 kg) BMI (Calculated): 43.41 Weight at Last Visit: 236 lb Weight Lost Since Last Visit: 0 Weight Gained Since Last Visit: 9 lb Starting Weight: 270 lb Total Weight Loss (lbs): 25 lb (11.3 kg) Peak Weight: 275 lb   Body Composition  Body Fat %: 50.2 % Fat Mass (lbs): 123.2 lbs Muscle Mass (lbs): 116 lbs Total Body Water (lbs): 91.2 lbs Visceral Fat Rating : 16   Other Clinical Data RMR: 2275 Fasting: no Labs: no Today's Visit #: 21 Starting Date: 08/11/22    Chief Complaint: OBESITY   History of Present Illness Ebony Ortiz is a 53 year old female with obesity who presents for a follow-up on her obesity treatment plan and to assess her progress.  She has been on a category four obesity treatment plan but has gained nine pounds in the last month. Her insurance no longer covers her Wegovy , which she believes has contributed to her weight gain. She experiences excessive fatigue, falling asleep while eating and at work, and has increased hunger and cravings for carbohydrates, such as French fries, which she typically does not consume.  She is currently taking levothyroxine  150 mcg per day. Her TSH was last checked at [facility] last month and was within normal limits at 3.697, although it was previously elevated at 24 over a year ago. She has not had her TSH checked in the current system for over a year.  She describes significant workplace stress, feeling overwhelmed by the workload and lack of support at her job at [employer]. She reports being physically exhausted, with her job requiring her to walk up to 50,000 steps a day. She reports increased hunger and often chooses sleep over eating due to exhaustion.  Her current diet includes gluten-free waffles and turkey sausages for breakfast, followed by two yogurts and two string cheeses  during her break. For lunch, she has two rice cakes with peanut butter. She reports falling asleep during lunch and dinner due to fatigue. She also mentions consuming protein drinks as a backup when needed.  She has a history of leg cramps, which she has experienced before starting her current job. She takes magnesium and drinks plenty of water, including liquid IV, to manage this. She is also on Requip 4 mg for her leg cramps and has varicose veins.      PHYSICAL EXAM:  Blood pressure 106/69, pulse 87, temperature 98.1 F (36.7 C), height 5' 3 (1.6 m), weight 245 lb (111.1 kg), SpO2 98%. Body mass index is 43.4 kg/m.  DIAGNOSTIC DATA REVIEWED:  BMET    Component Value Date/Time   NA 137 01/11/2024 1036   K 4.4 01/11/2024 1036   CL 102 01/11/2024 1036   CO2 19 (L) 01/11/2024 1036   GLUCOSE 87 01/11/2024 1036   BUN 25 (H) 01/11/2024 1036   CREATININE 1.04 (H) 01/11/2024 1036   CALCIUM 9.5 01/11/2024 1036   Lab Results  Component Value Date   HGBA1C 5.0 01/11/2024   HGBA1C 5.6 08/11/2022   Lab Results  Component Value Date   INSULIN  21.2 01/11/2024   INSULIN  27.7 (H) 08/11/2022   Lab Results  Component Value Date   TSH 24.000 (H) 08/11/2022   CBC    Component Value Date/Time   WBC 4.7 08/11/2022 1021   RBC 3.79 08/11/2022 1021   HGB 11.9 08/11/2022 1021   HCT  34.2 08/11/2022 1021   PLT 199 08/11/2022 1021   MCV 90 08/11/2022 1021   MCH 31.4 08/11/2022 1021   MCHC 34.8 08/11/2022 1021   RDW 12.4 08/11/2022 1021   Iron Studies No results found for: IRON, TIBC, FERRITIN, IRONPCTSAT Lipid Panel     Component Value Date/Time   CHOL 125 01/11/2024 1036   TRIG 111 01/11/2024 1036   HDL 29 (L) 01/11/2024 1036   CHOLHDL 5.3 (H) 06/22/2023 1106   LDLCALC 75 01/11/2024 1036   Hepatic Function Panel     Component Value Date/Time   PROT 8.3 01/11/2024 1036   ALBUMIN 4.6 01/11/2024 1036   AST 55 (H) 01/11/2024 1036   ALT 40 (H) 01/11/2024 1036    ALKPHOS 53 01/11/2024 1036   BILITOT 0.5 01/11/2024 1036      Component Value Date/Time   TSH 24.000 (H) 08/11/2022 1021   Nutritional Lab Results  Component Value Date   VD25OH 53.7 01/11/2024   VD25OH 53.7 06/22/2023   VD25OH 46.3 12/13/2022     Assessment and Plan Assessment & Plan Morbid obesity (BMI 40.0-44.9)  Gained nine pounds in the last month, likely due to cessation of Wegovy  and increased physical activity at work. Reports increased hunger and cravings for carbohydrates, possibly exacerbated by stress and physical exertion. Current weight management plan is not effective, and she is unable to maintain a strict diet due to work demands. - Prescribed metformin 500 mg daily with the first meal to stabilize blood sugars and reduce appetite. - Encouraged increased protein intake and portion control. - Advised against consuming simple carbohydrates to prevent worsening insulin  resistance. - Encouraged exploration of other job opportunities that may be less physically demanding.  Excessive fatigue Reports excessive fatigue, falling asleep during meals and at work, likely due to high stress and physical demands of her job. Recent TSH levels are normal, ruling out thyroid  dysfunction as a cause. Fatigue may also be related to inadequate sleep and nutritional intake. - Encouraged adequate rest and sleep, especially during days off. - Advised on maintaining a balanced diet with sufficient protein intake to support energy levels.  Stress She is under a high level of stress that appears to be untenable and is potentially compromising her health. -Encouraged to look for other work opportunities -Offered support and encouragement - Will continue to monitor mood. Counseling may be helpful, but there is a long wait list and she is unlikely to have the time to pursue this      I personally spent a total of 40 minutes in the care of the patient today including preparing to see the  patient, performing a medically appropriate evaluation of current problems, placing orders in the EMR, documenting clinical information in the EMR, customized nutritional counseling for their specific health and social needs, and explaining the pathophysiology of obesity and how it is significantly more complex than eat less and exercise more.  Jamine was counseled on the importance of maintaining healthy lifestyle habits, including balanced nutrition, regular physical activity, and behavioral modifications, while taking antiobesity medication.  Patient verbalized understanding that medication is an adjunct to, not a replacement for, lifestyle changes and that the long-term success and weight maintenance depend on continued adherence to these strategies.   Arleth was informed of the importance of frequent follow up visits to maximize her success with intensive lifestyle modifications for her obesity and obesity related health conditions as recommended by USPSTF and CMS guidelines   Louann Penton, MD
